# Patient Record
Sex: Female | Born: 1945 | Race: White | Hispanic: No | Marital: Married | Smoking: Never smoker
Health system: Southern US, Community
[De-identification: ages and names within clinical notes are randomized; demographics above are authoritative.]

## PROBLEM LIST (undated history)

## (undated) HISTORY — PX: BREAST EXCISIONAL BIOPSY: SUR124

---

## 1979-02-04 HISTORY — PX: BREAST BIOPSY: SHX20

## 2004-03-01 ENCOUNTER — Ambulatory Visit: Payer: Self-pay | Admitting: Obstetrics and Gynecology

## 2005-03-07 ENCOUNTER — Ambulatory Visit: Payer: Self-pay | Admitting: Obstetrics and Gynecology

## 2006-02-20 ENCOUNTER — Ambulatory Visit: Payer: Self-pay | Admitting: Gastroenterology

## 2006-04-02 ENCOUNTER — Ambulatory Visit: Payer: Self-pay | Admitting: Obstetrics and Gynecology

## 2006-10-29 ENCOUNTER — Ambulatory Visit: Payer: Self-pay | Admitting: Family Medicine

## 2007-01-07 ENCOUNTER — Ambulatory Visit: Payer: Self-pay | Admitting: Obstetrics and Gynecology

## 2007-04-08 ENCOUNTER — Ambulatory Visit: Payer: Self-pay | Admitting: Unknown Physician Specialty

## 2008-04-10 ENCOUNTER — Ambulatory Visit: Payer: Self-pay | Admitting: Unknown Physician Specialty

## 2008-10-31 ENCOUNTER — Ambulatory Visit: Payer: Self-pay | Admitting: Unknown Physician Specialty

## 2009-06-25 ENCOUNTER — Ambulatory Visit: Payer: Self-pay | Admitting: Unknown Physician Specialty

## 2009-10-23 ENCOUNTER — Ambulatory Visit: Payer: Self-pay | Admitting: Family Medicine

## 2009-10-26 ENCOUNTER — Ambulatory Visit: Payer: Self-pay | Admitting: Family Medicine

## 2009-11-05 ENCOUNTER — Ambulatory Visit: Payer: Self-pay | Admitting: Family Medicine

## 2009-12-14 ENCOUNTER — Ambulatory Visit: Payer: Self-pay | Admitting: Unknown Physician Specialty

## 2009-12-20 LAB — PATHOLOGY REPORT

## 2010-01-04 ENCOUNTER — Ambulatory Visit: Payer: Self-pay | Admitting: Family Medicine

## 2010-07-11 ENCOUNTER — Ambulatory Visit: Payer: Self-pay | Admitting: Family Medicine

## 2011-02-17 ENCOUNTER — Other Ambulatory Visit: Payer: Self-pay | Admitting: Unknown Physician Specialty

## 2011-04-07 ENCOUNTER — Ambulatory Visit: Payer: Self-pay | Admitting: Unknown Physician Specialty

## 2011-04-26 ENCOUNTER — Emergency Department: Payer: Self-pay | Admitting: Emergency Medicine

## 2011-04-26 LAB — CK TOTAL AND CKMB (NOT AT ARMC)
CK, Total: 67 U/L (ref 21–215)
CK, Total: 64 U/L (ref 21–215)
CK-MB: 0.7 ng/mL (ref 0.5–3.6)
CK-MB: 0.6 ng/mL (ref 0.5–3.6)

## 2011-04-26 LAB — COMPREHENSIVE METABOLIC PANEL
Anion Gap: 11 (ref 7–16)
BUN: 11 mg/dL (ref 7–18)
Bilirubin,Total: 0.7 mg/dL (ref 0.2–1.0)
EGFR (African American): >60
EGFR (Non-African Amer.): 55 — ABNORMAL LOW
Glucose: 94 mg/dL (ref 65–99)
Osmolality: 277 (ref 275–301)
SGOT(AST): 31 U/L (ref 15–37)
SGPT (ALT): 26 U/L
Sodium: 139 mmol/L (ref 136–145)
Total Protein: 7.2 g/dL (ref 6.4–8.2)

## 2011-04-26 LAB — TROPONIN I: Troponin-I: <0.02 ng/mL

## 2011-04-26 LAB — CBC
HGB: 14.4 g/dL (ref 12.0–16.0)
MCH: 29.2 pg (ref 26.0–34.0)
MCV: 85 fL (ref 80–100)
Platelet: 92 10*3/uL — ABNORMAL LOW (ref 150–440)
RBC: 4.95 10*6/uL (ref 3.80–5.20)
WBC: 3.9 10*3/uL (ref 3.6–11.0)

## 2011-04-26 LAB — PROTIME-INR: INR: 1.1

## 2011-06-26 IMAGING — CR DG LUMBAR SPINE COMPLETE 4+V
1 series · 5 of 5 positions shown · non-contrast
Comparison: none

REASON FOR EXAM: chronic pain  ? compression fx
COMMENTS:

[Series 1: view not recorded · 0.17mm/px · 5 of 5 slices shown]
[im 1/5]
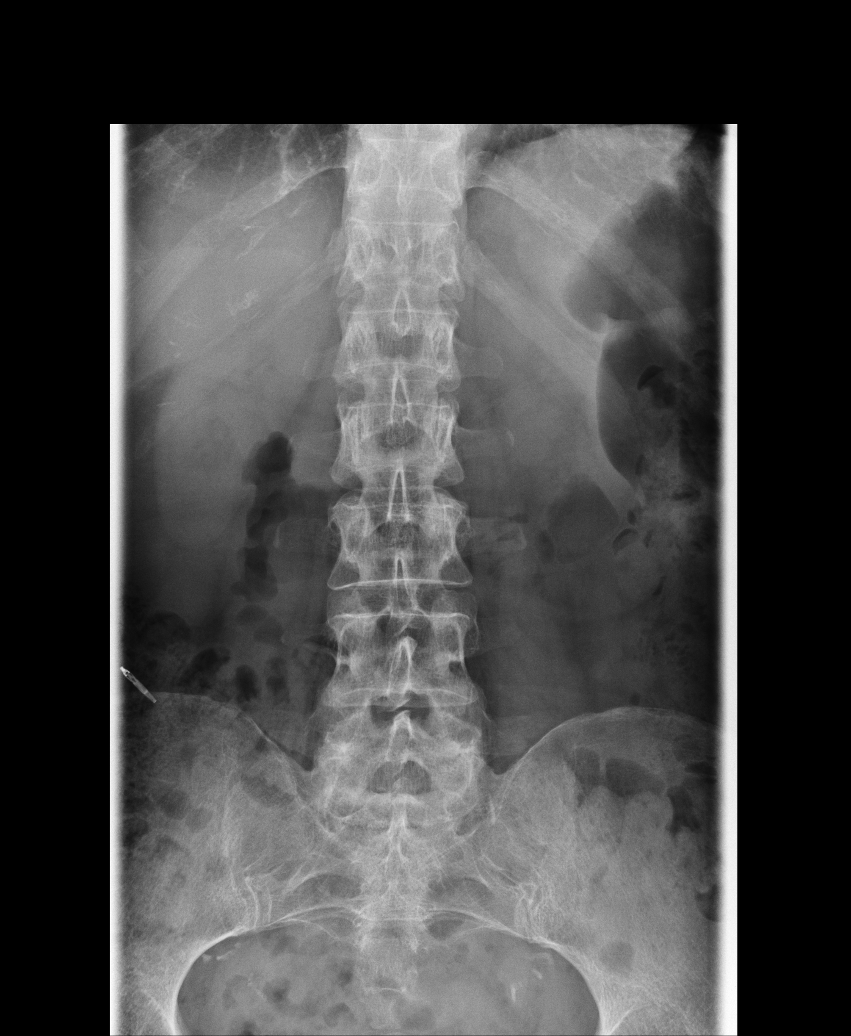
[im 2/5]
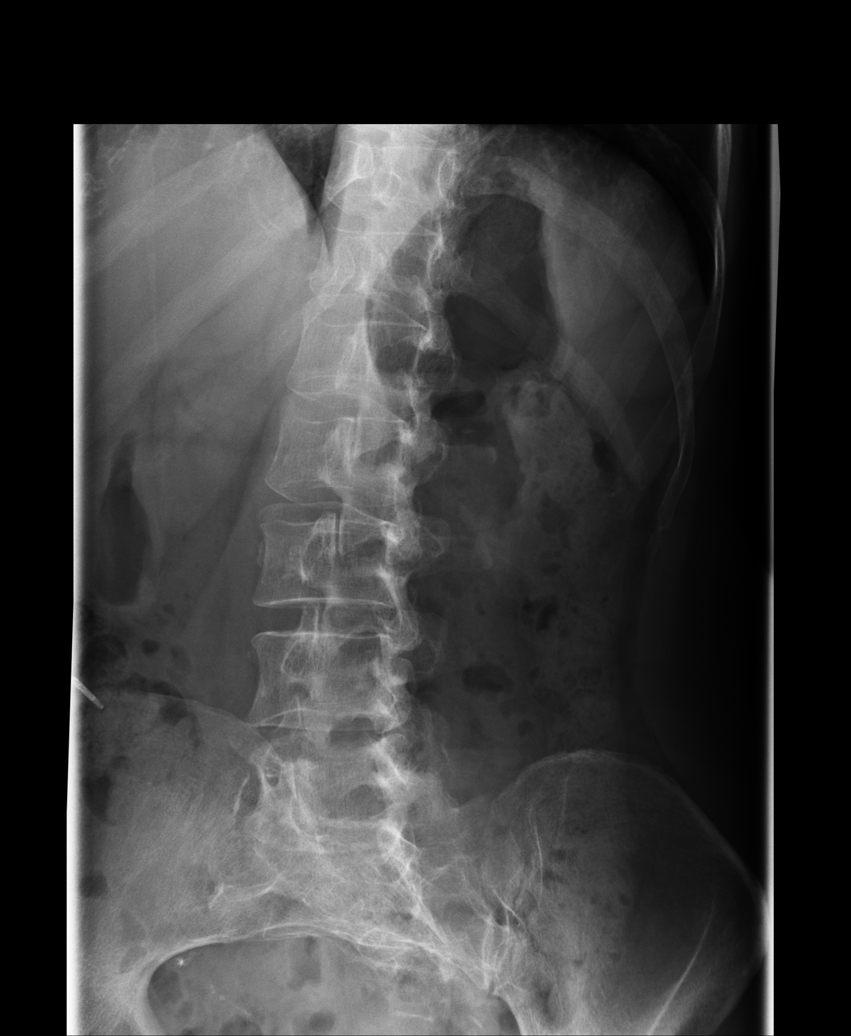
[im 3/5]
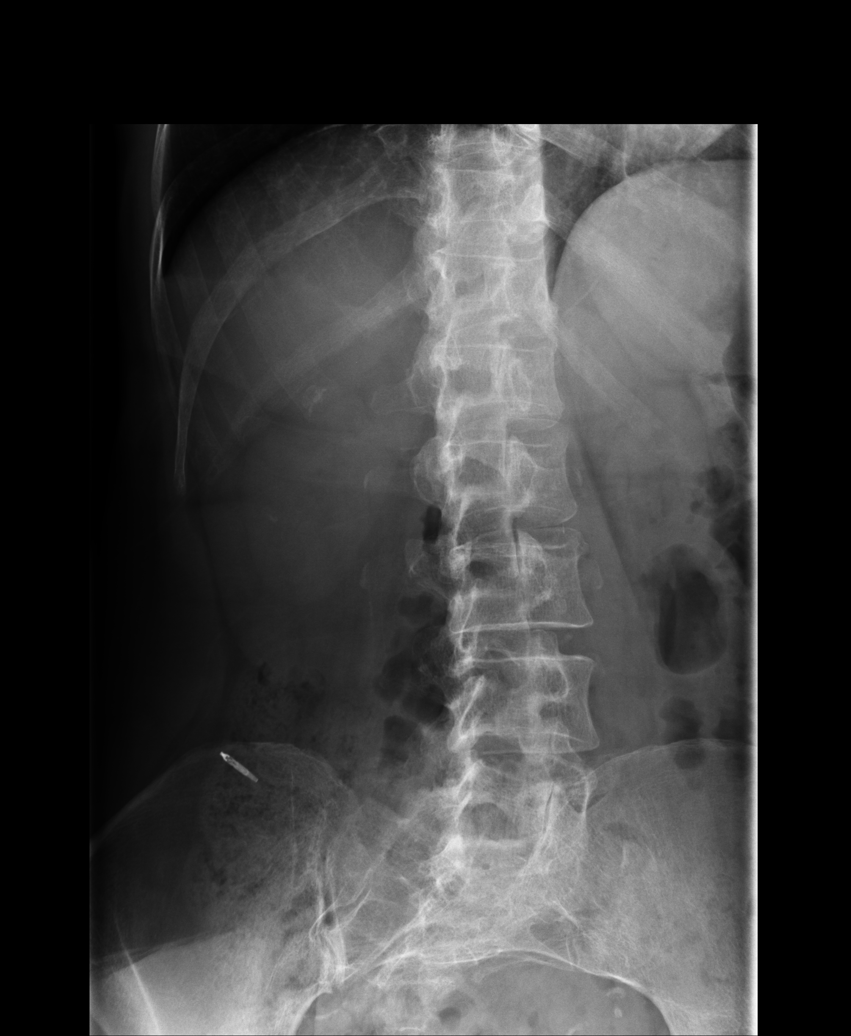
[im 4/5]
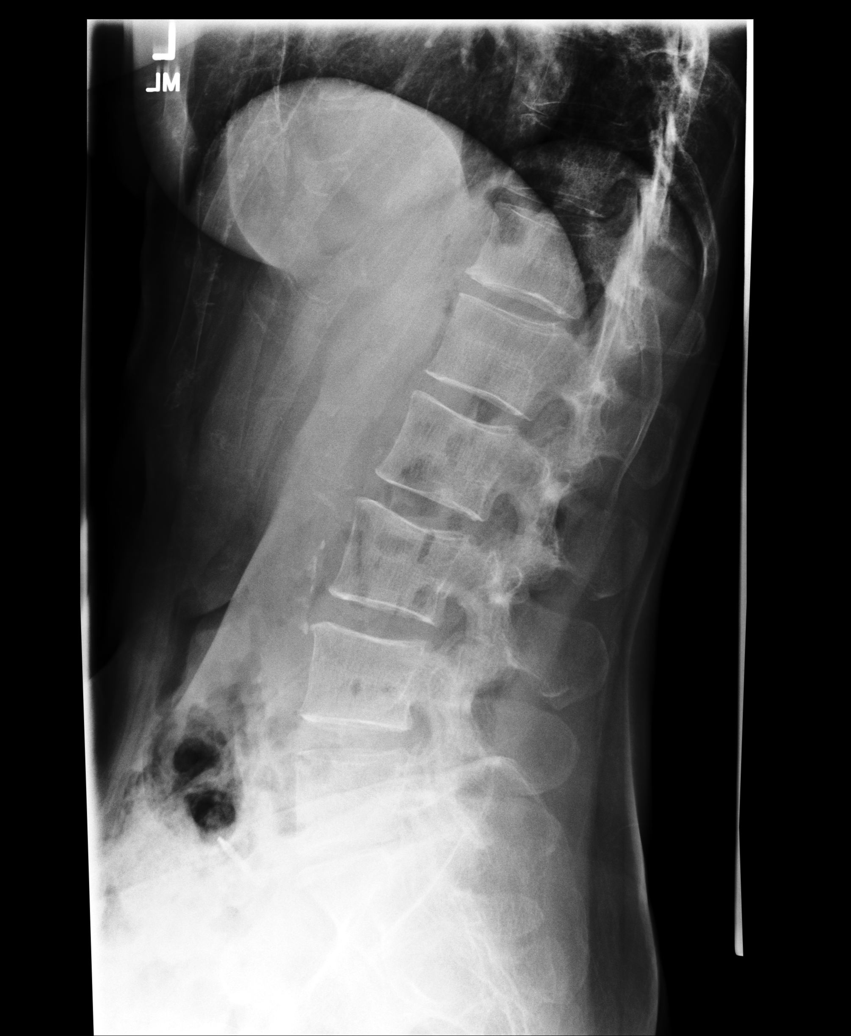
[im 5/5]
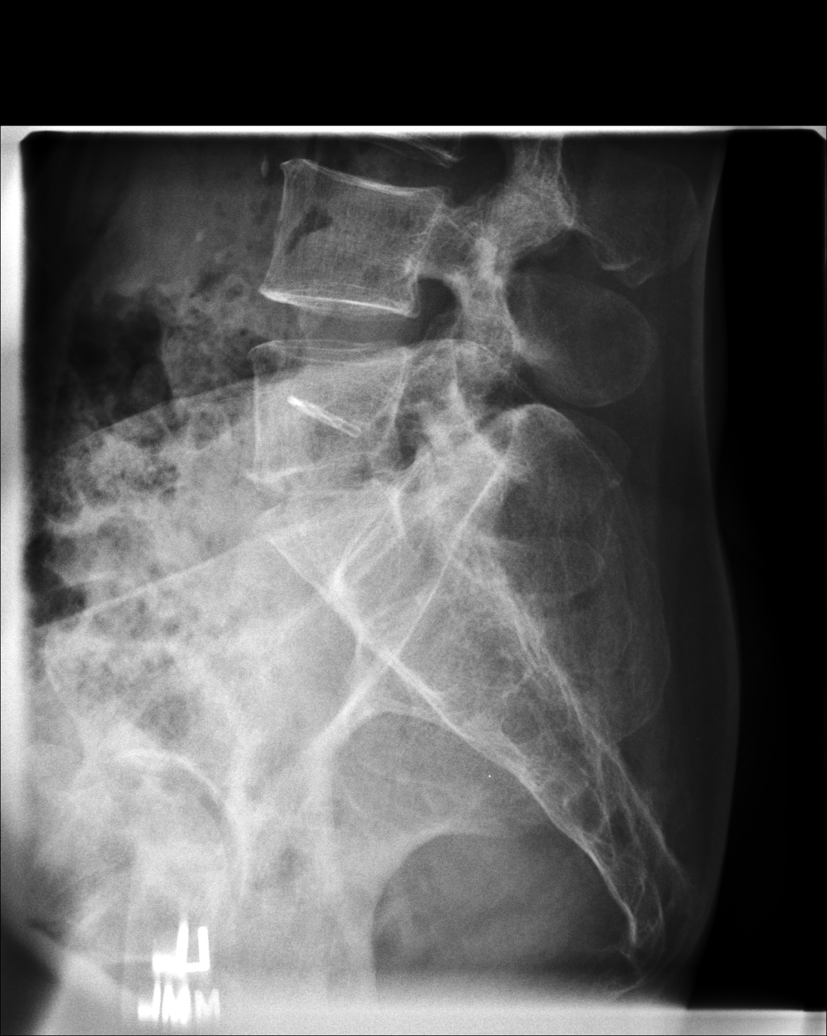

[5 of 5 positions shown; findings below may reference images not displayed]

PROCEDURE:     MDR - M[REDACTED] SPINE WITH OBLIQUES  - January 04, 2010  [DATE]

RESULT:     The lumbar vertebral bodies are preserved in height. The
intervertebral disc space heights are well-maintained. The spinous processes
are intact. I see no pars defects nor spondylolisthesis. The observed
portions of the sacrum appear normal.
IMPRESSION: I see no acute abnormality of the lumbar spine.

## 2011-07-29 ENCOUNTER — Ambulatory Visit: Payer: Self-pay

## 2012-06-03 ENCOUNTER — Ambulatory Visit: Payer: Self-pay | Admitting: Unknown Physician Specialty

## 2012-06-24 ENCOUNTER — Ambulatory Visit: Payer: Self-pay | Admitting: Surgery

## 2012-06-24 LAB — HEPATIC FUNCTION PANEL A (ARMC)
Albumin: 4.2 g/dL (ref 3.4–5.0)
Alkaline Phosphatase: 71 U/L (ref 50–136)
Bilirubin,Total: 0.6 mg/dL (ref 0.2–1.0)
SGPT (ALT): 24 U/L (ref 12–78)
Total Protein: 7.4 g/dL (ref 6.4–8.2)

## 2012-06-24 LAB — CBC WITH DIFFERENTIAL/PLATELET
Basophil %: 0.4 %
Eosinophil #: 0.1 10*3/uL (ref 0.0–0.7)
Lymphocyte %: 25.9 %
MCV: 84 fL (ref 80–100)
Monocyte #: 0.3 x10 3/mm (ref 0.2–0.9)
Monocyte %: 6.8 %
Neutrophil #: 3 10*3/uL (ref 1.4–6.5)
Platelet: 111 10*3/uL — ABNORMAL LOW (ref 150–440)
RBC: 5.1 10*6/uL (ref 3.80–5.20)
RDW: 13.2 % (ref 11.5–14.5)

## 2012-06-24 LAB — BASIC METABOLIC PANEL
Calcium, Total: 9.5 mg/dL (ref 8.5–10.1)
Chloride: 106 mmol/L (ref 98–107)
Co2: 31 mmol/L (ref 21–32)
Creatinine: 1.1 mg/dL (ref 0.60–1.30)
Glucose: 67 mg/dL (ref 65–99)
Osmolality: 278 (ref 275–301)
Sodium: 140 mmol/L (ref 136–145)

## 2012-07-15 ENCOUNTER — Ambulatory Visit: Payer: Self-pay | Admitting: Surgery

## 2012-08-23 ENCOUNTER — Ambulatory Visit: Payer: Self-pay | Admitting: Gastroenterology

## 2012-09-16 ENCOUNTER — Ambulatory Visit: Payer: Self-pay | Admitting: Gastroenterology

## 2012-09-20 LAB — PATHOLOGY REPORT

## 2012-11-23 ENCOUNTER — Ambulatory Visit: Payer: Self-pay

## 2013-08-19 ENCOUNTER — Ambulatory Visit: Payer: Self-pay | Admitting: Physician Assistant

## 2013-11-25 ENCOUNTER — Ambulatory Visit: Payer: Self-pay | Admitting: Internal Medicine

## 2014-05-26 NOTE — H&P (Signed)
PATIENT NAME:  Jillian [name], Jillian [name] MR#:  [id number] DATE OF BIRTH:  05-05-1945  DATE OF OPERATION:  07/15/2012  PREOPERATIVE HISTORY AND PHYSICAL  CHIEF COMPLAINT:  Gallbladder polyps.   HISTORY OF PRESENT ILLNESS:  This is a patient with barely atypical gallbladder symptoms. In fact, she only has left upper quadrant pain on occasion. She has been on PPI and H2-blockers with no relief and no classic symptoms of right upper quadrant pain. She has possibly had some acholic stools in the past. No melena or hematochezia. However, on ultrasound repeated multiple times she has had gallbladder polyps and has failed to show clearcut gallbladder gallstones but polyps have persisted. She is here for elective laparoscopic cholecystectomy for these polyps with potential for malignancy.   PAST MEDICAL HISTORY:  History of West Nile virus, thrombocytopenia associated with a viremia, history of colon polyps gastritis and hypothyroidism.   PAST SURGICAL HISTORY: Appendectomy, Meckel's diverticulectomy, lumpectomy of the breast and thyroidectomy total.   MEDICATIONS:  Multiple, see chart.   ALLERGIES:  IODINATED CONTRAST MEDIA.   SOCIAL HISTORY:  The patient does not smoke or drink. She is an Charity fundraiserN.   FAMILY HISTORY: Noncontributory.  REVIEW OF SYSTEMS: 10-system review has been performed and is documented in the office chart, negative with the exception of that mentioned in the HPI.  PHYSICAL EXAMINATION: GENERAL: Healthy-appearing female patient.  HEENT:  Shows no scleral icterus. No palpable neck nodes.  CHEST:  Clear to auscultation.  CARDIAC:  Regular rate and rhythm.  ABDOMEN: Soft, nontender. No hernias are noted. There is a right lower quadrant appendectomy-type incision and a right paramedian incision.  EXTREMITIES:  Without edema.  NEUROLOGIC:  Grossly intact.  INTEGUMENT:  Shows no jaundice.   A hepatobiliary scan is normal with a 46% ejection fraction. Abdominal ultrasound shows multiple  gallbladder polyps, largest being 6 mm in size.   LABORATORY VALUES: Demonstrate white blood cell count of 4.6, H and H of 14.7 and 42.6 and a platelet count of 111. AST, ALT and bilirubin are within normal limits.   ASSESSMENT AND PLAN:  This is a patient with gallbladder polyps. No classic symptoms of gallbladder disease but persistent polyps on repeated ultrasounds. I have recommended laparoscopic cholecystectomy because of the malignant potential. The rationale for this has been discussed. The options of observation have been reviewed. The risks of bleeding, infection, recurrence of symptoms and failure to resolve any symptoms that she may have, bile duct damage, bile duct leak, retained common bile duct stone, any of which could require further surgery and the need for further surgery if malignancy were identified was all discussed. She understood and agreed to proceed.   ____________________________ Adah Salvageichard E. Excell Seltzerooper, MD rec:ce D: 07/14/2012 17:51:14 ET [name]: 07/14/2012 18:25:43 ET JOB#: [id number]  cc: Adah Salvageichard E. Excell Seltzerooper, MD, <Dictator> Lattie HawICHARD E Irys Nigh MD ELECTRONICALLY SIGNED 07/17/2012 11:22

## 2014-05-26 NOTE — Op Note (Signed)
PATIENT NAME:  Jillian [name], BOZARTH MR#:  [id number] DATE OF BIRTH:  [birth year]  DATE OF PROCEDURE:  07/15/2012  PREOPERATIVE DIAGNOSIS: Gallbladder polyps.   POSTOPERATIVE DIAGNOSIS: Gallbladder polyps.   PROCEDURE: Laparoscopic cholecystectomy with C-arm fluoroscopic cholangiography.   SURGEON: Makaia Rappa E. Excell Seltzer, MD  ANESTHESIA: General with endotracheal tube.    ASSISTANT: Trellis Moment, PA-S  INDICATIONS: This is a patient with gallbladder polyps with symptoms that are not referable to typical gallbladder symptoms; however, because of the routine monitoring of gallbladder polyps that are increasing in size and number, she is requesting laparoscopic cholecystectomy.   Preoperatively, we discussed the rationale for surgery, the options of observation, risk of bleeding, infection, recurrence of symptoms, failure to resolve any of her symptoms. She understood and agreed to proceed. We also discussed the risk of bile duct injury, bile duct leak, the need for ERCP or further surgery. This was reviewed as well.   FINDINGS: Normal-appearing gallbladder. No palpable stones or polyps. C-arm fluoroscopic cholangiography demonstrated good flow in the duodenum without intraluminal filling defects. Proximal ducts were well identified, and the cystic duct had been cannulated.  DESCRIPTION OF PROCEDURE: The patient was induced to general anesthesia. She was given IV antibiotics. VTE prophylaxis was in place. SHE WAS ALSO GIVEN BENADRYL AND SOLU-CORTEF FOR HER DYE ALLERGY.   She was prepped and draped in a sterile fashion. Marcaine was infiltrated in skin and subcutaneous tissues around the periumbilical area. Incision was made. Veress needle was placed. Pneumoperitoneum was obtained, and a 5 mm trocar port was placed. The abdominal cavity was explored. Adhesions were noted in the right lower and right upper quadrant to colon. Manipulation around these adhesions demonstrated an open space around the  gallbladder and liver, and under direct vision, a 10 mm epigastric port and 2 lateral 5 mm ports were placed.   The camera was placed in the epigastric site to view back at the adhesions, and no injury of the bowel or adhesions had been noted, and the entry point of the periumbilical port was well away from the attachment of any of these adhesions. Again, no bowel injury was noted.   With the camera in the periumbilical site, the gallbladder was placed on tension. The peritoneum over the infundibulum was incised bluntly. The cystic duct-gallbladder junction was well identified, clipped and incised, and through a separate incision, a cholangiogram catheter was placed. C-arm fluoroscopic cholangiography demonstrated the above. The cholangiogram catheter was removed. The cystic duct was doubly clipped and divided. The cystic artery was doubly clipped and divided, and then the gallbladder was taken from the gallbladder fossa with electrocautery and passed out through the epigastric port site. The port site was replaced. The area was irrigated with copious amounts of normal saline. There was no sign of bleeding, bile leak or bowel injury   Viewing the left upper quadrant, there was no ability to see the spleen as it was buried beneath omentum and greater curve of the stomach, and without manipulation of this area, the spleen could not be identified.   Camera was placed again in the epigastric site to view back to the periumbilical site. Again, no sign of bowel injury. Therefore, pneumoperitoneum was released. All ports were removed. Fascial edges at the epigastric site were approximated with figure-of-eight 0 Vicryls, 4-0 subcuticular Monocryl was used on all skin edges. Steri-Strips, Mastisol and sterile dressings were placed.   The patient tolerated the procedure well. There were no complications. She was taken to the recovery room in  stable condition to be discharged in the care of her family. Follow up in  10 days.   ____________________________ Adah Salvageichard E. Excell Seltzerooper, MD rec:OSi D: 07/15/2012 10:27:40 ET T: 07/15/2012 11:58:08 ET JOB#: [id number]  cc: Adah Salvageichard E. Excell Seltzerooper, MD, <Dictator> Lattie HawICHARD E Chip Canepa MD ELECTRONICALLY SIGNED 07/17/2012 11:22

## 2014-11-06 ENCOUNTER — Other Ambulatory Visit: Payer: Self-pay | Admitting: Internal Medicine

## 2014-11-06 DIAGNOSIS — Z1231 Encounter for screening mammogram for malignant neoplasm of breast: Secondary | ICD-10-CM

## 2014-11-15 ENCOUNTER — Ambulatory Visit: Payer: Self-pay

## 2014-12-05 ENCOUNTER — Ambulatory Visit
Admission: RE | Admit: 2014-12-05 | Discharge: 2014-12-05 | Disposition: A | Discharge status: Home / Self Care | Payer: Medicare Other | Source: Ambulatory Visit | Attending: Internal Medicine | Admitting: Internal Medicine

## 2014-12-05 DIAGNOSIS — Z1231 Encounter for screening mammogram for malignant neoplasm of breast: Secondary | ICD-10-CM | POA: Diagnosis not present

## 2014-12-08 ENCOUNTER — Other Ambulatory Visit: Payer: Self-pay | Admitting: Internal Medicine

## 2014-12-08 DIAGNOSIS — R928 Other abnormal and inconclusive findings on diagnostic imaging of breast: Secondary | ICD-10-CM

## 2014-12-11 ENCOUNTER — Ambulatory Visit
Admission: RE | Admit: 2014-12-11 | Discharge: 2014-12-11 | Disposition: A | Discharge status: Home / Self Care | Payer: Medicare Other | Source: Ambulatory Visit | Attending: Internal Medicine | Admitting: Internal Medicine

## 2014-12-11 DIAGNOSIS — R928 Other abnormal and inconclusive findings on diagnostic imaging of breast: Secondary | ICD-10-CM

## 2015-10-11 ENCOUNTER — Other Ambulatory Visit: Payer: Self-pay | Admitting: Obstetrics and Gynecology

## 2015-10-11 DIAGNOSIS — Z1231 Encounter for screening mammogram for malignant neoplasm of breast: Secondary | ICD-10-CM

## 2015-12-10 ENCOUNTER — Ambulatory Visit
Admission: RE | Admit: 2015-12-10 | Discharge: 2015-12-10 | Disposition: A | Discharge status: Home / Self Care | Payer: Medicare Other | Source: Ambulatory Visit | Attending: Obstetrics and Gynecology | Admitting: Obstetrics and Gynecology

## 2015-12-10 DIAGNOSIS — Z1231 Encounter for screening mammogram for malignant neoplasm of breast: Secondary | ICD-10-CM | POA: Diagnosis not present

## 2016-10-30 ENCOUNTER — Other Ambulatory Visit: Payer: Self-pay | Admitting: Obstetrics and Gynecology

## 2016-10-30 ENCOUNTER — Other Ambulatory Visit: Payer: Self-pay | Admitting: Internal Medicine

## 2016-10-30 DIAGNOSIS — Z1231 Encounter for screening mammogram for malignant neoplasm of breast: Secondary | ICD-10-CM

## 2016-12-12 ENCOUNTER — Ambulatory Visit
Admission: RE | Admit: 2016-12-12 | Discharge: 2016-12-12 | Disposition: A | Discharge status: Home / Self Care | Payer: Medicare Other | Source: Ambulatory Visit | Attending: Internal Medicine | Admitting: Internal Medicine

## 2016-12-12 DIAGNOSIS — Z1231 Encounter for screening mammogram for malignant neoplasm of breast: Secondary | ICD-10-CM | POA: Diagnosis present

## 2017-04-30 ENCOUNTER — Emergency Department: Payer: Medicare Other

## 2017-04-30 ENCOUNTER — Other Ambulatory Visit: Payer: Self-pay

## 2017-04-30 ENCOUNTER — Emergency Department
Admission: EM | Admit: 2017-04-30 | Discharge: 2017-04-30 | Disposition: A | Discharge status: Home / Self Care | Payer: Medicare Other | Attending: Emergency Medicine | Admitting: Emergency Medicine

## 2017-04-30 DIAGNOSIS — R42 Dizziness and giddiness: Secondary | ICD-10-CM | POA: Diagnosis not present

## 2017-04-30 DIAGNOSIS — R110 Nausea: Secondary | ICD-10-CM | POA: Diagnosis not present

## 2017-04-30 DIAGNOSIS — R51 Headache: Secondary | ICD-10-CM | POA: Diagnosis not present

## 2017-04-30 DIAGNOSIS — R519 Headache, unspecified: Secondary | ICD-10-CM

## 2017-04-30 DIAGNOSIS — I159 Secondary hypertension, unspecified: Secondary | ICD-10-CM | POA: Diagnosis present

## 2017-04-30 LAB — CBC WITH DIFFERENTIAL/PLATELET
Basophils Absolute: 0 10*3/uL (ref 0–0.1)
Basophils Relative: 1 %
EOS ABS: 0 10*3/uL (ref 0–0.7)
EOS PCT: 1 %
HCT: 40.3 % (ref 35.0–47.0)
HEMOGLOBIN: 13.9 g/dL (ref 12.0–16.0)
LYMPHS ABS: 0.9 10*3/uL — AB (ref 1.0–3.6)
Lymphocytes Relative: 17 %
MCH: 29.1 pg (ref 26.0–34.0)
MCHC: 34.6 g/dL (ref 32.0–36.0)
MCV: 84.4 fL (ref 80.0–100.0)
MONOS PCT: 6 %
Monocytes Absolute: 0.3 10*3/uL (ref 0.2–0.9)
Neutro Abs: 4.4 10*3/uL (ref 1.4–6.5)
Neutrophils Relative %: 77 %
PLATELETS: 105 10*3/uL — AB (ref 150–440)
RBC: 4.77 MIL/uL (ref 3.80–5.20)
RDW: 14.1 % (ref 11.5–14.5)
WBC: 5.7 10*3/uL (ref 3.6–11.0)

## 2017-04-30 LAB — BASIC METABOLIC PANEL
Anion gap: 10 (ref 5–15)
BUN: 13 mg/dL (ref 6–20)
CHLORIDE: 104 mmol/L (ref 101–111)
CO2: 25 mmol/L (ref 22–32)
CREATININE: 1 mg/dL (ref 0.44–1.00)
Calcium: 9.5 mg/dL (ref 8.9–10.3)
GFR calc Af Amer: >60 mL/min (ref >60)
GFR calc non Af Amer: 55 mL/min — ABNORMAL LOW (ref >60)
GLUCOSE: 95 mg/dL (ref 65–99)
Potassium: 3.5 mmol/L (ref 3.5–5.1)
Sodium: 139 mmol/L (ref 135–145)

## 2017-04-30 MED ORDER — ACETAMINOPHEN 500 MG PO TABS
1000.0000 mg | ORAL_TABLET | Freq: Once | ORAL | Status: AC
  Administered 2017-04-30: 1000 mg via ORAL
  Filled 2017-04-30: qty 2

## 2017-04-30 MED ORDER — PROMETHAZINE HCL 25 MG PO TABS
25.0000 mg | ORAL_TABLET | Freq: Once | ORAL | Status: AC
  Administered 2017-04-30: 25 mg via ORAL
  Filled 2017-04-30: qty 1

## 2017-04-30 MED ORDER — PROMETHAZINE HCL 12.5 MG PO TABS: ORAL_TABLET | ORAL | 0 refills | Status: AC

## 2017-04-30 MED ORDER — CYCLOBENZAPRINE HCL 10 MG PO TABS
5.0000 mg | ORAL_TABLET | Freq: Once | ORAL | Status: AC
  Administered 2017-04-30: 5 mg via ORAL
  Filled 2017-04-30: qty 1

## 2017-04-30 MED ORDER — DIPHENHYDRAMINE HCL 25 MG PO CAPS
25.0000 mg | ORAL_CAPSULE | Freq: Once | ORAL | Status: AC
  Administered 2017-04-30: 25 mg via ORAL
  Filled 2017-04-30: qty 1

## 2017-04-30 MED ORDER — MECLIZINE HCL 12.5 MG PO TABS: ORAL_TABLET | ORAL | 0 refills | Status: AC

## 2017-04-30 NOTE — ED Triage Notes (Signed)
Pt states her BP was elevated at home and headache (started on the 5th when she had kenalog injection)

## 2017-04-30 NOTE — Discharge Instructions (Addendum)
Your exam, labs, and CT scan are all essentially normal today. Continue to monitor your blood pressure (2-3 times a day). Take the home meds as discussed. Take the prescription meds as directed. Follow-up with your provider or Dr. Sherryll BurgerShah for ongoing headache management. Return to the ED for worsening or intractable pain.

## 2017-04-30 NOTE — ED Provider Notes (Signed)
[facility] Emergency Department Provider Note ____________________________________________  Time seen: 1350  I have reviewed the triage vital signs and the nursing notes.  HISTORY  Chief Complaint  Hypertension  HPI Jillian [name] is a 72 y.o. female to the ED accompanied by her husband, for concern of elevated blood pressures.  Patient has been monitoring her blood pressure since yesterday.  She apparently was seen by her primary care provider yesterday, and started on a low-dose amlodipine.  Patient describes onset of headaches 3 weeks earlier, following in an dermal injection of Kenalog.  She was treated by a local dermatologist for a resume posterior scalp sebaceous cyst.  The dermal cyst was injected with Kenalog at the time.  Patient's been managing her headaches at home with Tylenol but denies any significant benefit.  She apparently checked her blood pressure a few days ago and noted it to be elevated.  She scheduled appointment with her PCPs PA provider, and was started on a low-dose of amlodipine at 2.5 mg daily.  She took her first dose yesterday and has been monitoring her blood pressures about every 2 hours since.  She noted blood pressures ranging from 140-180 systolic and 80-100 diastolic.  Today she dosed a second amlodipine about about 10 AM.  At that point she began to experience some dizziness and nausea.  Also is noted her headache persist primarily on the left hemisphere of the head from the forehead to the occiput.  Patient is about 1 month out from a cataract surgery on her eyes.  She continues to note light sensitivity with the headache pain.  Her headache is not significantly management Tylenol or naproxen she is been dosing.  She reports headache pain at a 7 out of 10 currently.  She denies any visual disturbance of above baseline.  She also denies any weakness, chest pain, shortness of breath, or recent illness.  She is without any distal  paresthesias, footdrop, or falls.  Patient is a retired Programmer, applications, and is not sure what the underlying cause for her headache, hypertension, and new onset dizziness is.  History reviewed. No pertinent past medical history.  There are no active problems to display for this patient.   Past Surgical History:  Procedure Laterality Date  . BREAST BIOPSY Right 1981   neg    Prior to Admission medications   Medication Sig Start Date End Date Taking? Authorizing Provider  meclizine (ANTIVERT) 12.5 MG tablet Take 1-2 tabs every 8 hour PRN for N&V 04/30/17   Meah Jiron, Charlesetta Ivory, PA-C  promethazine (PHENERGAN) 12.5 MG tablet Take 1-2 tabs every 8 hours as needed for nausea & vomiting 04/30/17   Neftali Abair, Charlesetta Ivory, PA-C    Allergies Atorvastatin and Ivp dye [iodinated diagnostic agents]  No family history on file.  Social History Social History   Tobacco Use  . Smoking status: Never Smoker  . Smokeless tobacco: Never Used  Substance Use Topics  . Alcohol use: Never    Frequency: Never  . Drug use: Never    Review of Systems  Constitutional: Negative for fever. Eyes: Negative for visual changes. ENT: Negative for sore throat. Cardiovascular: Negative for chest pain. Notes elevated BPs. Respiratory: Negative for shortness of breath. Gastrointestinal: Negative for abdominal pain, vomiting and diarrhea. Genitourinary: Negative for dysuria. Musculoskeletal: Negative for back pain. Skin: Negative for rash. Neurological: Negative for focal weakness or numbness. Reports headache, vertigo and dizziness.  ____________________________________________  PHYSICAL EXAM:  VITAL SIGNS: ED Triage  Vitals [04/30/17 1328]  Enc Vitals Group     BP (!) 173/74     Pulse Rate 71     Resp 15     Temp (!) 97.4 F (36.3 C)     Temp Source Oral     SpO2 100 %     Weight 128 lb (58.1 kg)     Height 5\' 9"  (1.753 m)     Head Circumference      Peak Flow      Pain Score 5     Pain  Loc      Pain Edu?      Excl. in GC?     Constitutional: Alert and oriented. Well appearing and in no distress. Head: Normocephalic and atraumatic. Eyes: Conjunctivae are normal. PERRL. Normal extraocular movements. Light sensitive.  Ears: Canals clear. TMs intact bilaterally. Nose: No congestion/rhinorrhea/epistaxis. Mouth/Throat: Mucous membranes are moist. Neck: Supple. No thyromegaly. Normal ROM. No palpable cysts, masses, or abscesses.  Cardiovascular: Normal rate, regular rhythm. Normal distal pulses. Respiratory: Normal respiratory effort. No wheezes/rales/rhonchi. Gastrointestinal: Soft and nontender. No distention. Musculoskeletal: Nontender with normal range of motion in all extremities.  Neurologic: Cranial nerves II through XII grossly intact.  Patient with slow transition from sit to stand.  She is unable to walk unassisted or without standby assistance due to dizziness.  Normal speech and language. No gross focal neurologic deficits are appreciated. Skin:  Skin is warm, dry and intact. No rash noted. Psychiatric: Mood and affect are normal. Patient exhibits appropriate insight and judgment. ____________________________________________   LABS (pertinent positives/negatives)  Labs Reviewed  BASIC METABOLIC PANEL - Abnormal; Notable for the following components:      Result Value   GFR calc non Af Amer 55 (*)    All other components within normal limits  CBC WITH DIFFERENTIAL/PLATELET - Abnormal; Notable for the following components:   Platelets 105 (*)    Lymphs Abs 0.9 (*)    All other components within normal limits  ____________________________________________   RADIOLOGY  Head Ct w/o CM  IMPRESSION: Age related volume loss with slight periventricular small vessel disease. No mass or hemorrhage. No evident acute infarct.  There are foci of arterial vascular calcification. There is mucosal thickening in several ethmoid air  cells. ____________________________________________  PROCEDURES  Procedures Promethazine 25 mg PO Diphenhydramine 25 mg PO Cyclobenzaprine 5 mg PO Acetaminophen 1000 mg PO ____________________________________________  INITIAL IMPRESSION / ASSESSMENT AND PLAN / ED COURSE  Patient with ED evaluation of 3-week complaint of intermittent left-sided headaches as well as recent elevation of her blood pressures above baseline.  Patient with an overall benign exam is reassured by her normal labs and negative head CT.  She and her husband have been very appreciative of the care provided.  And have been fully involved in the patient's treatment.  She has some previous medication sensitivities and is inclined to be discharged with a prescription for Phenergan and meclizine which she is tolerated in the past.  She will continue to dose Tylenol for headache pain relief and avoid any anti-inflammatories until her blood pressures normalized.  She will continue with the amlodipine at 5 mg daily as prescribed.  She is encouraged to monitor blood pressures but to limit herself to 2-3 blood pressure readings per day.  She will follow-up with Dr. Sherryll Burger neurology for ongoing persistent headache pain.  Return precautions have been reviewed with the patient and her husband.  They verbalized understanding of all instructions and are happy to  discharge to follow-up with PCP. ____________________________________________  FINAL CLINICAL IMPRESSION(S) / ED DIAGNOSES  Final diagnoses:  Secondary hypertension  Dizziness  Acute intractable headache, unspecified headache type      Lissa HoardMenshew, Veneta Sliter V Bacon, PA-C 04/30/17 1754    Phineas SemenGoodman, Graydon, MD 04/30/17 [phone]

## 2017-04-30 NOTE — ED Notes (Signed)
Patient transported to CT 

## 2017-10-16 ENCOUNTER — Other Ambulatory Visit (HOSPITAL_COMMUNITY): Payer: Self-pay | Admitting: Neurology

## 2017-10-16 DIAGNOSIS — R413 Other amnesia: Secondary | ICD-10-CM

## 2017-10-21 ENCOUNTER — Ambulatory Visit (HOSPITAL_COMMUNITY): Payer: Medicare Other

## 2017-10-27 ENCOUNTER — Ambulatory Visit (HOSPITAL_COMMUNITY): Payer: Medicare Other

## 2017-11-03 ENCOUNTER — Ambulatory Visit (HOSPITAL_COMMUNITY)
Admission: RE | Admit: 2017-11-03 | Discharge: 2017-11-03 | Disposition: A | Discharge status: Home / Self Care | Payer: Medicare Other | Source: Ambulatory Visit | Attending: Neurology | Admitting: Neurology

## 2017-11-03 DIAGNOSIS — R413 Other amnesia: Secondary | ICD-10-CM | POA: Insufficient documentation

## 2017-11-03 DIAGNOSIS — R9082 White matter disease, unspecified: Secondary | ICD-10-CM | POA: Insufficient documentation

## 2017-11-11 ENCOUNTER — Other Ambulatory Visit: Payer: Self-pay | Admitting: Internal Medicine

## 2017-11-11 DIAGNOSIS — Z1231 Encounter for screening mammogram for malignant neoplasm of breast: Secondary | ICD-10-CM

## 2017-12-18 ENCOUNTER — Ambulatory Visit
Admission: RE | Admit: 2017-12-18 | Discharge: 2017-12-18 | Disposition: A | Discharge status: Home / Self Care | Payer: Medicare Other | Source: Ambulatory Visit | Attending: Internal Medicine | Admitting: Internal Medicine

## 2017-12-18 DIAGNOSIS — Z1231 Encounter for screening mammogram for malignant neoplasm of breast: Secondary | ICD-10-CM | POA: Insufficient documentation

## 2018-01-13 ENCOUNTER — Ambulatory Visit: Payer: Self-pay | Admitting: Physical Therapy

## 2018-01-15 ENCOUNTER — Ambulatory Visit: Payer: Self-pay | Admitting: Physical Therapy

## 2018-11-04 ENCOUNTER — Other Ambulatory Visit: Payer: Self-pay | Admitting: Internal Medicine

## 2018-11-04 DIAGNOSIS — Z1231 Encounter for screening mammogram for malignant neoplasm of breast: Secondary | ICD-10-CM

## 2018-11-16 ENCOUNTER — Other Ambulatory Visit: Payer: Self-pay | Admitting: Internal Medicine

## 2018-11-16 DIAGNOSIS — R413 Other amnesia: Secondary | ICD-10-CM

## 2018-11-28 ENCOUNTER — Other Ambulatory Visit: Payer: Self-pay

## 2018-11-28 ENCOUNTER — Ambulatory Visit
Admission: RE | Admit: 2018-11-28 | Discharge: 2018-11-28 | Disposition: A | Discharge status: Home / Self Care | Payer: Medicare Other | Source: Ambulatory Visit | Attending: Internal Medicine | Admitting: Internal Medicine

## 2018-11-28 DIAGNOSIS — R413 Other amnesia: Secondary | ICD-10-CM | POA: Insufficient documentation

## 2018-12-21 ENCOUNTER — Ambulatory Visit
Admission: RE | Admit: 2018-12-21 | Discharge: 2018-12-21 | Disposition: A | Discharge status: Home / Self Care | Payer: Medicare Other | Source: Ambulatory Visit | Attending: Internal Medicine | Admitting: Internal Medicine

## 2018-12-21 DIAGNOSIS — Z1231 Encounter for screening mammogram for malignant neoplasm of breast: Secondary | ICD-10-CM | POA: Diagnosis present

## 2019-03-26 ENCOUNTER — Ambulatory Visit: Payer: Medicare Other | Attending: Internal Medicine

## 2019-03-26 DIAGNOSIS — Z23 Encounter for immunization: Secondary | ICD-10-CM | POA: Insufficient documentation

## 2019-03-26 NOTE — Progress Notes (Signed)
   Covid-19 Vaccination Clinic  Name:  Tanya Crothers    MRN: [id number] DOB: 1945/06/09  03/26/2019  Ms. Lasseigne was observed post Covid-19 immunization for 15 minutes without incidence. She was provided with Vaccine Information Sheet and instruction to access the V-Safe system.   Ms. Kisamore was instructed to call 911 with any severe reactions post vaccine: Marland Kitchen Difficulty breathing  . Swelling of your face and throat  . A fast heartbeat  . A bad rash all over your body  . Dizziness and weakness    Immunizations Administered    Name Date Dose VIS Date Route   Pfizer COVID-19 Vaccine 03/26/2019 11:03 AM 0.3 mL 01/14/2019 Intramuscular   Manufacturer: [employer], [employer]   Lot: [id number]   NDC: [address]-3

## 2019-04-19 ENCOUNTER — Ambulatory Visit: Payer: Medicare Other | Attending: Internal Medicine

## 2019-04-19 DIAGNOSIS — Z23 Encounter for immunization: Secondary | ICD-10-CM

## 2019-04-19 NOTE — Progress Notes (Signed)
   Covid-19 Vaccination Clinic  Name:  Jillian [name]    MRN: [id number] DOB: 1945-02-17  04/19/2019  [name] was observed post Covid-19 immunization for 15 minutes without incident. She was provided with Vaccine Information Sheet and instruction to access the V-Safe system.   [name] was instructed to call 911 with any severe reactions post vaccine: Marland Kitchen Difficulty breathing  . Swelling of face and throat  . A fast heartbeat  . A bad rash all over body  . Dizziness and weakness   Immunizations Administered    Name Date Dose VIS Date Route   Pfizer COVID-19 Vaccine 04/19/2019 11:10 AM 0.3 mL 01/14/2019 Intramuscular   Manufacturer: [employer], [employer]   Lot: [id number]   NDC: [address]-0

## 2019-12-28 ENCOUNTER — Other Ambulatory Visit: Payer: Self-pay | Admitting: Internal Medicine

## 2019-12-28 DIAGNOSIS — R413 Other amnesia: Secondary | ICD-10-CM

## 2020-01-06 ENCOUNTER — Ambulatory Visit
Admission: RE | Admit: 2020-01-06 | Discharge: 2020-01-06 | Disposition: A | Discharge status: Home / Self Care | Payer: Medicare Other | Source: Ambulatory Visit | Attending: Internal Medicine | Admitting: Internal Medicine

## 2020-01-06 ENCOUNTER — Other Ambulatory Visit: Payer: Self-pay

## 2020-01-06 DIAGNOSIS — R413 Other amnesia: Secondary | ICD-10-CM | POA: Insufficient documentation

## 2021-01-23 ENCOUNTER — Other Ambulatory Visit: Payer: Self-pay | Admitting: Family Medicine

## 2021-01-23 DIAGNOSIS — R519 Headache, unspecified: Secondary | ICD-10-CM

## 2021-02-02 ENCOUNTER — Ambulatory Visit: Admission: RE | Admit: 2021-02-02 | Payer: Medicare Other | Source: Ambulatory Visit

## 2021-02-06 ENCOUNTER — Other Ambulatory Visit: Payer: Self-pay | Admitting: Internal Medicine

## 2021-02-06 DIAGNOSIS — R519 Headache, unspecified: Secondary | ICD-10-CM

## 2021-02-13 ENCOUNTER — Ambulatory Visit
Admission: RE | Admit: 2021-02-13 | Discharge: 2021-02-13 | Disposition: A | Discharge status: Home / Self Care | Payer: Medicare Other | Source: Ambulatory Visit | Attending: Internal Medicine | Admitting: Internal Medicine

## 2021-02-13 ENCOUNTER — Other Ambulatory Visit: Payer: Self-pay | Admitting: Internal Medicine

## 2021-02-13 DIAGNOSIS — R519 Headache, unspecified: Secondary | ICD-10-CM | POA: Diagnosis not present

## 2021-02-19 ENCOUNTER — Ambulatory Visit: Payer: Medicare Other

## 2021-02-26 ENCOUNTER — Ambulatory Visit: Payer: Medicare Other | Attending: Neurology | Admitting: Physical Therapy

## 2021-02-26 ENCOUNTER — Other Ambulatory Visit: Payer: Self-pay

## 2021-02-26 DIAGNOSIS — M542 Cervicalgia: Secondary | ICD-10-CM | POA: Diagnosis present

## 2021-02-26 DIAGNOSIS — M2560 Stiffness of unspecified joint, not elsewhere classified: Secondary | ICD-10-CM | POA: Diagnosis present

## 2021-02-28 ENCOUNTER — Ambulatory Visit: Payer: Medicare Other | Admitting: Physical Therapy

## 2021-03-02 NOTE — Therapy (Deleted)
[facility] [address] [place], [place], [address] Phone: [phone]   Fax:  [phone]  Physical Therapy Evaluation  Patient Details  Name: Nayelly Laughman MRN: [id number] Date of Birth: July 15, 1945 Referring Provider (PT): Dr. Sherryll Burger   Encounter Date: 02/26/2021    PT End of Session - 03/02/21 1435     Visit Number 1    Number of Visits 9    Date for PT Re-Evaluation 03/26/21    Authorization - Visit Number 1    Authorization - Number of Visits 10    PT Start Time 1109    PT Stop Time 1207    PT Time Calculation (min) 58 min    Activity Tolerance Patient tolerated treatment well    Behavior During Therapy [facility] for tasks assessed/performed           No past medical history on file.  Past Surgical History:  Procedure Laterality Date   BREAST BIOPSY Right 1981   neg   BREAST EXCISIONAL BIOPSY       There were no vitals filed for this visit.    Subjective Assessment - 03/04/21 1317     Subjective Pt. referred to PT after experiencing extreme HAs at night/morning.  Pt. reports migraines have been worsening since mid-December.  Pt. had recent MRI (see scanned results).    Patient is accompained by: Family member    Pertinent History Pt. went to [facility] about 2.5 weeks ago.  Pt. reports feeling cold and has been sensitive to sound/ light.  Pts. husband reports that pt. has been having memory issues (short-term) which have worsened over past couple years.    Diagnostic tests MRI    Patient Stated Goals Decrease headaches/ neck pain.    Currently in Pain? Yes    Pain Location Head              Plan - 03/05/21 0735     Clinical Impression Statement Pt. is a pleasant 76 y/o female with c/o significant headaches over past couple months.  Pt. reports minimal pain currently at rest but has had significant episodes of head pain with no known MOI.  Pt. does reports worsening in memory loss.  Pt. is highly  motivated to exercise/ participate with daily household tasks.  FOTO: initial 65/ goal 72.  Seated cervical AROM: flexion (58 deg.), extension (72 deg.), L lat. (44 deg.), R lat. (46 deg.), L rotn. (52 deg.), R rotn. (65 deg.).  B shoulder strength grossly 5/5 MMT except B sh. abduction (4/5 MMT), ER/IR (4+/5 MMT).  Grip strength (R hand dominant): L 49.7#/ R 58.1#.  Pt. presents with good spinal mobility in upper cervical/ thoracic spine (L/R) in supine position.  Pt. will benefit from short-term skilled PT services to address neck/ head pain and improve B UE strength/ postural mobility.    Stability/Clinical Decision Making Evolving/Moderate complexity    Clinical Decision Making Moderate    Rehab Potential Good    PT Frequency 2x / week    PT Duration 4 weeks    PT Treatment/Interventions ADLs/Self Care Home Management;Electrical Stimulation;Cryotherapy;Moist Heat;Traction;Functional mobility training;Therapeutic activities;Neuromuscular re-education;Therapeutic exercise;Patient/family education;Manual techniques;Dry needling;Passive range of motion    PT Next Visit Plan Issue HEP             Patient will benefit from skilled therapeutic intervention in order to improve the following deficits and impairments:  Improper body mechanics, Decreased range of motion, Decreased activity tolerance, Decreased strength, Pain, Postural  dysfunction  Visit Diagnosis: Neck pain  Joint stiffness     Problem List There are no problems to display for this patient.   Cammie Mcgee, PT 03/05/2021, 7:59 AM   [facility] [address] [place], [place], [address] Phone: [phone]   Fax:  [phone]  Name: Saharah Sherrow MRN: [id number] Date of Birth: 10-28-1945

## 2021-03-04 ENCOUNTER — Encounter: Payer: Self-pay | Admitting: Physical Therapy

## 2021-03-05 ENCOUNTER — Ambulatory Visit: Payer: Medicare Other | Admitting: Physical Therapy

## 2021-03-05 ENCOUNTER — Encounter: Payer: Self-pay | Admitting: Physical Therapy

## 2021-03-05 ENCOUNTER — Other Ambulatory Visit: Payer: Self-pay

## 2021-03-05 DIAGNOSIS — M542 Cervicalgia: Secondary | ICD-10-CM

## 2021-03-05 DIAGNOSIS — M2560 Stiffness of unspecified joint, not elsewhere classified: Secondary | ICD-10-CM

## 2021-03-05 NOTE — Patient Instructions (Signed)
Access Code: [email or web address] URL: [email or web address] Date: 03/05/2021 Prepared by: Dorcas Carrow  Exercises Upper Trapezius Stretch - 2 x daily - 7 x weekly - 1 sets - 3 reps - 20 seconds hold Seated Levator Scapulae Stretch - 1 x daily - 7 x weekly - 1 sets - 3 reps - 20 seconds hold Seated Cervical Retraction - 1 x daily - 7 x weekly - 1 sets - 10 reps - 3-5 seconds hold Seated Scapular Retraction - 1 x daily - 7 x weekly - 1 sets - 20 reps - 3-5 seconds hold Doorway Pec Stretch at 90 Degrees Abduction - 1 x daily - 7 x weekly - 3 sets - 3 reps - 30 seconds hold

## 2021-03-05 NOTE — Therapy (Addendum)
[facility] [address] [place], [place], [address] Phone: [phone]   Fax:  [phone]  Physical Therapy Evaluation  Patient Details  Name: Jillian [name] MRN: [id number] Date of Birth: December 16, 1945 Referring Provider (PT): Dr. Sherryll Burger   Encounter Date: 02/26/2021    PT End of Session - 03/02/21 1435     Visit Number 1    Number of Visits 9    Date for PT Re-Evaluation 03/26/21    Authorization - Visit Number 1    Authorization - Number of Visits 10    PT Start Time 1109    PT Stop Time 1207    PT Time Calculation (min) 58 min    Activity Tolerance Patient tolerated treatment well    Behavior During Therapy [facility] for tasks assessed/performed           History reviewed. No pertinent past medical history.  Past Surgical History:  Procedure Laterality Date   BREAST BIOPSY Right 1981   neg   BREAST EXCISIONAL BIOPSY      There were no vitals filed for this visit.    Subjective Assessment - 03/04/21 1317     Subjective Pt. referred to PT after experiencing extreme HAs at night/morning.  Pt. reports migraines have been worsening since mid-December.  Pt. had recent MRI (see scanned results).    Patient is accompained by: Family member    Pertinent History Pt. went to [facility] about 2.5 weeks ago.  Pt. reports feeling cold and has been sensitive to sound/ light.  Pts. husband reports that pt. has been having memory issues (short-term) which have worsened over past couple years.    Diagnostic tests MRI    Patient Stated Goals Decrease headaches/ neck pain.    Currently in Pain? Yes    Pain Location Head                [facility] PT Assessment - 03/04/21 0001       Assessment   Medical Diagnosis Headache/ Neck Pain    Referring Provider (PT) Dr. Sherryll Burger    Onset Date/Surgical Date 02/03/21    Prior Therapy Not for HA or neck pain      Home Environment   Living Environment Private residence      Prior  Function   Level of Independence Independent      Cognition   Overall Cognitive Status Within Functional Limits for tasks assessed    Memory Impaired    Memory Impairment Decreased short term memory            See clinical impression  Slight forward head/ rounded shoulder posture in sitting.  Pt. Able to correct with cuing. Good spinal alignment/ cervical traction. No increase pain during palpation of B UT/ levator musculature.    Objective measurements completed on examination: See above findings.      PT Education - 03/04/21 1332     Education Details Discussed UT/levator/ cervical rotn. stretches and standing pec stretches at doorway.    Person(s) Educated Patient;Spouse    Methods Explanation;Demonstration    Comprehension Verbalized understanding;Returned demonstration                 PT Long Term Goals - 03/05/21 0749       PT LONG TERM GOAL #1   Title Pt. will increase FOTO to 72 to improve pain-free mobility.    Baseline Initial FOTO: 65    Time 4    Period Weeks  Status New    Target Date 03/26/21      PT LONG TERM GOAL #2   Title Pt. will increase cervical L rotn. to >65 deg. to improve pain-free mobility.    Baseline L cervical rotn. (52 deg.), R rotn. 65 deg.    Time 4    Period Weeks    Status New    Target Date 03/26/21      PT LONG TERM GOAL #3   Title Pt. will increase B shoulder strength to grossly 5/5 MMT with no increase c/o neck pain.    Baseline B shoulder strength grossly 5/5 MMT except B sh. abduction (4/5 MMT), ER/IR (4+/5 MMT). Grip strength (R hand dominant): L 49.7#/ R 58.1#.    Time 4    Period Weeks    Status New    Target Date 03/26/21      PT LONG TERM GOAL #4   Title Pt. will report no increase c/o headaches consistently for 1 week to improve sleeping/ ADL.    Baseline Pt. experiencing frequent migraines limiting sleep/ daily function    Time 4    Period Weeks    Status New    Target Date 03/26/21                Plan - 03/05/21 0735     Clinical Impression Statement Pt. is a pleasant 76 y/o female with c/o significant headaches over past couple months.  Pt. reports minimal pain currently at rest but has had significant episodes of head pain with no known MOI.  Pt. does reports worsening in memory loss.  Pt. is highly motivated to exercise/ participate with daily household tasks.  FOTO: initial 65/ goal 72.  Seated cervical AROM: flexion (58 deg.), extension (72 deg.), L lat. (44 deg.), R lat. (46 deg.), L rotn. (52 deg.), R rotn. (65 deg.).  B shoulder strength grossly 5/5 MMT except B sh. abduction (4/5 MMT), ER/IR (4+/5 MMT).  Grip strength (R hand dominant): L 49.7#/ R 58.1#.  Pt. presents with good spinal mobility in upper cervical/ thoracic spine (L/R) in supine position.  Pt. will benefit from short-term skilled PT services to address neck/ head pain and improve B UE strength/ postural mobility.    Stability/Clinical Decision Making Evolving/Moderate complexity    Clinical Decision Making Moderate    Rehab Potential Good    PT Frequency 2x / week    PT Duration 4 weeks    PT Treatment/Interventions ADLs/Self Care Home Management;Electrical Stimulation;Cryotherapy;Moist Heat;Traction;Functional mobility training;Therapeutic activities;Neuromuscular re-education;Therapeutic exercise;Patient/family education;Manual techniques;Dry needling;Passive range of motion    PT Next Visit Plan Issue HEP             Patient will benefit from skilled therapeutic intervention in order to improve the following deficits and impairments:  Improper body mechanics, Decreased range of motion, Decreased activity tolerance, Decreased strength, Pain, Postural dysfunction  Visit Diagnosis: Neck pain  Joint stiffness     Problem List There are no problems to display for this patient.  Cammie Mcgee, PT, DPT # [phone] 03/05/2021, 8:00 AM  [facility] [address] [place], [place], [address] Phone: [phone]   Fax:  [phone]  Name: Jillian [name] MRN: [id number] Date of Birth: [birth year]

## 2021-03-05 NOTE — Addendum Note (Signed)
Addended by: Cammie Mcgee on: 03/05/2021 08:08 AM   Modules accepted: Orders

## 2021-03-05 NOTE — Therapy (Signed)
[facility] [address] [place], [place], [address] Phone: [phone]   Fax:  [phone]  Physical Therapy Treatment  Patient Details  Name: Jillian [name] MRN: [id number] Date of Birth: 11/20/1945 Referring Provider (PT): Dr. Sherryll Burger   Encounter Date: 03/05/2021   PT End of Session - 03/05/21 1836     Visit Number 2    Number of Visits 9    Date for PT Re-Evaluation 03/26/21    Authorization - Visit Number 2    Authorization - Number of Visits 10    PT Start Time 1106    PT Stop Time 1205    PT Time Calculation (min) 59 min    Activity Tolerance Patient tolerated treatment well    Behavior During Therapy [facility] for tasks assessed/performed             History reviewed. No pertinent past medical history.  Past Surgical History:  Procedure Laterality Date   BREAST BIOPSY Right 1981   neg   BREAST EXCISIONAL BIOPSY      There were no vitals filed for this visit.   Subjective Assessment - 03/05/21 1834     Subjective Pt. had a f/u with Dr. Margaretmary Eddy office due to significant migraine last week.  See MD notes.  MD recommends TDN and Cefaly device    Patient is accompained by: Family member    Pertinent History Pt. went to [place] about 2.5 weeks ago.  Pt. reports feeling cold and has been sensitive to sound/ light.  Pts. husband reports that pt. has been having memory issues (short-term) which have worsened over past couple years.    Diagnostic tests MRI    Patient Stated Goals Decrease headaches/ neck pain.    Currently in Pain? Yes    Pain Score 5     Pain Location Head    Pain Orientation Anterior    Pain Type Chronic pain             Manual tx.:  See HEP/ reviewed  Supine UT/levator manual stretches 3x each with 20 sec. Static holds. Supine manual traction/ suboccipital release 3x with holds ("feels good") Supine/ prone STM to B UT/ cervical paraspinals prior to TDN.  TDN to cervical paraspinals/  suboccipital trigger points in prone position with 0.25x40 mm needles.  2 needles used and PT reviewed precautions/ contraindications to pt./ husband.  Pistoning technique to suboccipital region with no muscle fasciculations noted.  MH to upper thoracic/ low cervical region during TDN.  E-stim:  Supine position: EMPI continuum unit with 1 channel (2 electrodes) to forehead.  Parameters: 20 min./ rate 60 Hz/ width 252 microsec./ sym waveform/ 3.5-4.5 mA     PT Education - 03/05/21 1836     Education Details Access Code: [email or web address]    Person(s) Educated Patient    Methods Explanation;Demonstration;Handout    Comprehension Verbalized understanding;Returned demonstration                 PT Long Term Goals - 03/05/21 0749       PT LONG TERM GOAL #1   Title Pt. will increase FOTO to 72 to improve pain-free mobility.    Baseline Initial FOTO: 65    Time 4    Period Weeks    Status New    Target Date 03/26/21      PT LONG TERM GOAL #2   Title Pt. will increase cervical L rotn. to >65 deg. to improve pain-free mobility.  Baseline L cervical rotn. (52 deg.), R rotn. 65 deg.    Time 4    Period Weeks    Status New    Target Date 03/26/21      PT LONG TERM GOAL #3   Title Pt. will increase B shoulder strength to grossly 5/5 MMT with no increase c/o neck pain.    Baseline B shoulder strength grossly 5/5 MMT except B sh. abduction (4/5 MMT), ER/IR (4+/5 MMT). Grip strength (R hand dominant): L 49.7#/ R 58.1#.    Time 4    Period Weeks    Status New    Target Date 03/26/21      PT LONG TERM GOAL #4   Title Pt. will report no increase c/o headaches consistently for 1 week to improve sleeping/ ADL.    Baseline Pt. experiencing frequent migraines limiting sleep/ daily function    Time 4    Period Weeks    Status New    Target Date 03/26/21                Plan - 03/05/21 1837     Clinical Impression Statement Pt. tolerates TDN to suboccipital region in prone position  and supine e-stim to forehead with similar parameters of Cefaly device.  Pt. reports a good UT/ levator stretch in supine position and benefit from manual traction/ suboccipital release.  Pt. will continue to focus on HEP and will purchase a TENS unit to simulate Cefaly device for HA management at home.  Pt. will continue to benefit from skilled PT services to improve pain-free mobility.    Stability/Clinical Decision Making Evolving/Moderate complexity    Clinical Decision Making Moderate    Rehab Potential Good    PT Frequency 2x / week    PT Duration 4 weeks    PT Treatment/Interventions ADLs/Self Care Home Management;Electrical Stimulation;Cryotherapy;Moist Heat;Traction;Functional mobility training;Therapeutic activities;Neuromuscular re-education;Therapeutic exercise;Patient/family education;Manual techniques;Dry needling;Passive range of motion    PT Next Visit Plan Reassess HA and use of e-stim/ TDN    PT Home Exercise Plan Access Code: [email or web address]             Patient will benefit from skilled therapeutic intervention in order to improve the following deficits and impairments:  Improper body mechanics, Decreased range of motion, Decreased activity tolerance, Decreased strength, Pain, Postural dysfunction  Visit Diagnosis: Neck pain  Joint stiffness     Problem List There are no problems to display for this patient.  Cammie Mcgee, PT, DPT # [phone] 03/05/2021, 6:40 PM  [place] [facility] [address] [place], [place], [address] Phone: [phone]   Fax:  [phone]  Name: Jillian [name] MRN: [id number] Date of Birth: May 08, 1945

## 2021-03-07 ENCOUNTER — Ambulatory Visit: Payer: Medicare Other | Attending: Neurology | Admitting: Physical Therapy

## 2021-03-07 ENCOUNTER — Other Ambulatory Visit: Payer: Self-pay

## 2021-03-07 DIAGNOSIS — M542 Cervicalgia: Secondary | ICD-10-CM | POA: Insufficient documentation

## 2021-03-07 DIAGNOSIS — M2560 Stiffness of unspecified joint, not elsewhere classified: Secondary | ICD-10-CM | POA: Diagnosis present

## 2021-03-10 NOTE — Therapy (Signed)
[facility] [address] [place], [place], [address] Phone: [phone]   Fax:  [phone]  Physical Therapy Treatment  Patient Details  Name: Jillian [name] MRN: [id number] Date of Birth: [birth year] Referring Provider (PT): Dr. Manuella Ghazi   Encounter Date: 03/07/2021   PT End of Session - 03/10/21 0939     Visit Number 3    Number of Visits 9    Date for PT Re-Evaluation 03/26/21    Authorization - Visit Number 3    Authorization - Number of Visits 10    PT Start Time 1026    PT Stop Time 1119    PT Time Calculation (min) 53 min    Activity Tolerance Patient tolerated treatment well    Behavior During Therapy [facility] for tasks assessed/performed             No past medical history on file.  Past Surgical History:  Procedure Laterality Date   BREAST BIOPSY Right 1981   neg   BREAST EXCISIONAL BIOPSY      There were no vitals filed for this visit.   Subjective Assessment - 03/10/21 0932     Subjective Pt. reports 4/10 pain in head prior to tx. session.  Pt. states pain was worse yesterday.  Pt. reports BP was high this morning but vitals WNL prior to PT tx. session.  Pt. brought in new TENS unit to simulate a Cefaly device for trigeminal pain self-management at home.    Patient is accompained by: Family member    Pertinent History Pt. went to [facility] about 2.5 weeks ago.  Pt. reports feeling cold and has been sensitive to sound/ light.  Pts. husband reports that pt. has been having memory issues (short-term) which have worsened over past couple years.    Diagnostic tests MRI    Patient Stated Goals Decrease headaches/ neck pain.    Currently in Pain? Yes    Pain Score 4     Pain Location Head    Pain Orientation Anterior               Manual tx.:   Supine UT/levator manual stretches 3x each with 20 sec. Static holds. Supine manual traction/ suboccipital release 3x with holds ("feels good") Supine/ prone  STM to B UT/ cervical paraspinals prior to TDN.   TDN to cervical paraspinals/ suboccipital trigger points in prone position with 0.25x40 mm needles.  2 needles used and PT reviewed precautions/ contraindications to pt./ husband.  Pistoning technique to suboccipital region with no muscle fasciculations noted.  MH to upper thoracic/ low cervical region during TDN.   E-stim:   Pt. Brought in personal unit and PT set parameters to simulate Cefaly device for home use.  Pt./husband educate on proper placement of electrodes/ parameters for tx.   Supine position: Personal home TENS unit with 1 channel (2 electrodes) to forehead.  Parameters: 20 min./ rate 60 Hz/ width 250 microsec./ sym waveform/ 2.0-2.59mA         PT Education - 03/10/21 0938     Education Details Educated on proper set-up of electrodes/ parameters of TENS unit    Person(s) Educated Patient;Spouse    Methods Explanation;Demonstration;Handout    Comprehension Verbalized understanding;Returned demonstration                 PT Long Term Goals - 03/05/21 0749       PT LONG TERM GOAL #1   Title Pt. will increase FOTO to  72 to improve pain-free mobility.    Baseline Initial FOTO: 65    Time 4    Period Weeks    Status New    Target Date 03/26/21      PT LONG TERM GOAL #2   Title Pt. will increase cervical L rotn. to >65 deg. to improve pain-free mobility.    Baseline L cervical rotn. (52 deg.), R rotn. 65 deg.    Time 4    Period Weeks    Status New    Target Date 03/26/21      PT LONG TERM GOAL #3   Title Pt. will increase B shoulder strength to grossly 5/5 MMT with no increase c/o neck pain.    Baseline B shoulder strength grossly 5/5 MMT except B sh. abduction (4/5 MMT), ER/IR (4+/5 MMT). Grip strength (R hand dominant): L 49.7#/ R 58.1#.    Time 4    Period Weeks    Status New    Target Date 03/26/21      PT LONG TERM GOAL #4   Title Pt. will report no increase c/o headaches consistently for 1 week  to improve sleeping/ ADL.    Baseline Pt. experiencing frequent migraines limiting sleep/ daily function    Time 4    Period Weeks    Status New    Target Date 03/26/21                   Plan - 03/10/21 0940     Clinical Impression Statement Excellent tx. tolerance with supine manual cervical stretches/ traction with no increase in pain symptoms.  TDN to subocciptial region in prone with good tolerance/ no muscle fasciculations noted.  Pt./ husband educated in use of home TENS for pain mgmt. on a daily basis.  Pt. and husband demonstrate a good understanding of TENS and electrode placement.  Pt. instructed to contact PT if any questions of concerns.    Stability/Clinical Decision Making Evolving/Moderate complexity    Clinical Decision Making Moderate    Rehab Potential Good    PT Frequency 2x / week    PT Duration 4 weeks    PT Treatment/Interventions ADLs/Self Care Home Management;Electrical Stimulation;Cryotherapy;Moist Heat;Traction;Functional mobility training;Therapeutic activities;Neuromuscular re-education;Therapeutic exercise;Patient/family education;Manual techniques;Dry needling;Passive range of motion    PT Next Visit Plan Reassess use of home TENS and add strengthening ex. to HEP    PT Home Exercise Plan Access Code: [email or web address]             Patient will benefit from skilled therapeutic intervention in order to improve the following deficits and impairments:  Improper body mechanics, Decreased range of motion, Decreased activity tolerance, Decreased strength, Pain, Postural dysfunction  Visit Diagnosis: Neck pain  Joint stiffness     Problem List There are no problems to display for this patient.  Pura Spice, PT, DPT # [phone] 03/10/2021, 9:47 AM  [facility] [address] [place], [place], [address] Phone: [phone]   Fax:  [phone]  Name: Jillian [name] MRN: [id number] Date of Birth:  [birth year]

## 2021-03-12 ENCOUNTER — Ambulatory Visit: Payer: Medicare Other | Admitting: Physical Therapy

## 2021-03-12 ENCOUNTER — Other Ambulatory Visit: Payer: Self-pay

## 2021-03-12 DIAGNOSIS — M542 Cervicalgia: Secondary | ICD-10-CM | POA: Diagnosis not present

## 2021-03-12 DIAGNOSIS — M2560 Stiffness of unspecified joint, not elsewhere classified: Secondary | ICD-10-CM

## 2021-03-14 ENCOUNTER — Ambulatory Visit: Payer: Medicare Other | Admitting: Physical Therapy

## 2021-03-14 ENCOUNTER — Other Ambulatory Visit: Payer: Self-pay

## 2021-03-14 DIAGNOSIS — M542 Cervicalgia: Secondary | ICD-10-CM

## 2021-03-14 DIAGNOSIS — M2560 Stiffness of unspecified joint, not elsewhere classified: Secondary | ICD-10-CM

## 2021-03-16 NOTE — Therapy (Signed)
[facility] [address] [place], [place], [address] Phone: [phone]   Fax:  [phone]  Physical Therapy Treatment  Patient Details  Name: Briya Lookabaugh MRN: [id number] Date of Birth: 1945/12/17 Referring Provider (PT): Dr. Sherryll Burger   Encounter Date: 03/14/2021   PT End of Session - 03/16/21 1245     Visit Number 5    Number of Visits 9    Date for PT Re-Evaluation 03/26/21    Authorization - Visit Number 5    Authorization - Number of Visits 10    PT Start Time 1116    PT Stop Time 1202    PT Time Calculation (min) 46 min    Activity Tolerance Patient tolerated treatment well    Behavior During Therapy [facility] for tasks assessed/performed             No past medical history on file.  Past Surgical History:  Procedure Laterality Date   BREAST BIOPSY Right 1981   neg   BREAST EXCISIONAL BIOPSY      There were no vitals filed for this visit.   Subjective Assessment - 03/16/21 1240     Subjective Pt. states she had a difficult day with migraine yesterday.  Pt. has been weaning off caffeine/ chocolate and is more aware of triggers.  Pt. reports BP systolic has remained high recently.    Patient is accompained by: Family member    Pertinent History Pt. went to [facility] about 2.5 weeks ago.  Pt. reports feeling cold and has been sensitive to sound/ light.  Pts. husband reports that pt. has been having memory issues (short-term) which have worsened over past couple years.    Diagnostic tests MRI    Patient Stated Goals Decrease headaches/ neck pain.    Currently in Pain? Yes    Pain Score 5     Pain Location Head    Pain Orientation Anterior               Manual tx.:   Supine UT/levator manual stretches 3x each with 20 sec. Static holds. Supine manual traction/ suboccipital release 3x with holds ("feels good") Supine/ prone STM to B UT/ cervical paraspinals prior to TDN.   TDN to cervical paraspinals/  suboccipital trigger points in prone position with 0.25x40 mm needles.  2 needles used and PT reviewed precautions/ contraindications to pt./ husband.  Pistoning technique to suboccipital region with no muscle fasciculations noted.     E-stim:   Prone position: Personal home TENS unit with 2 channel (4-electrodes) to forehead.  Parameters: 20 min./ rate 60 Hz/ width 100 microsec./ sym waveform/ 2.5-3.55mA.  MH to upper thoracic/ low cervical region during TDN.        PT Long Term Goals - 03/05/21 0749       PT LONG TERM GOAL #1   Title Pt. will increase FOTO to 72 to improve pain-free mobility.    Baseline Initial FOTO: 65    Time 4    Period Weeks    Status New    Target Date 03/26/21      PT LONG TERM GOAL #2   Title Pt. will increase cervical L rotn. to >65 deg. to improve pain-free mobility.    Baseline L cervical rotn. (52 deg.), R rotn. 65 deg.    Time 4    Period Weeks    Status New    Target Date 03/26/21      PT LONG TERM GOAL #  3   Title Pt. will increase B shoulder strength to grossly 5/5 MMT with no increase c/o neck pain.    Baseline B shoulder strength grossly 5/5 MMT except B sh. abduction (4/5 MMT), ER/IR (4+/5 MMT). Grip strength (R hand dominant): L 49.7#/ R 58.1#.    Time 4    Period Weeks    Status New    Target Date 03/26/21      PT LONG TERM GOAL #4   Title Pt. will report no increase c/o headaches consistently for 1 week to improve sleeping/ ADL.    Baseline Pt. experiencing frequent migraines limiting sleep/ daily function    Time 4    Period Weeks    Status New    Target Date 03/26/21                   Plan - 03/16/21 1247     Clinical Impression Statement Pt. continues to present with good cervical AROM (all planes) and UE strength WNL.  PT focused on STM/ manual stretches in supine/ prone position.  Parallel electrode/sensorey TENS set up to B cervical/ upper thoracic paraspinals in prone with MH after tx.  Pts. BP showed marked  improvement (120/66) after tx. session.  Pt. will continue to focus on current HEP and instructed to contact PT if any questions or issues.    Stability/Clinical Decision Making Evolving/Moderate complexity    Clinical Decision Making Moderate    Rehab Potential Good    PT Frequency 2x / week    PT Duration 4 weeks    PT Treatment/Interventions ADLs/Self Care Home Management;Electrical Stimulation;Cryotherapy;Moist Heat;Traction;Functional mobility training;Therapeutic activities;Neuromuscular re-education;Therapeutic exercise;Patient/family education;Manual techniques;Dry needling;Passive range of motion    PT Next Visit Plan REASSESS GOALS.    PT Home Exercise Plan Access Code: [email or web address]             Patient will benefit from skilled therapeutic intervention in order to improve the following deficits and impairments:  Improper body mechanics, Decreased range of motion, Decreased activity tolerance, Decreased strength, Pain, Postural dysfunction  Visit Diagnosis: Neck pain  Joint stiffness     Problem List There are no problems to display for this patient.  Cammie Mcgee, PT, DPT # [phone] 03/16/2021, 1:03 PM  [facility] [address] [place], [place], [address] Phone: [phone]   Fax:  [phone]  Name: Breezie Micucci MRN: [id number] Date of Birth: 1945/09/24

## 2021-03-16 NOTE — Therapy (Signed)
[facility] [address] [place], [place], [address] Phone: [phone]   Fax:  [phone]  Physical Therapy Treatment  Patient Details  Name: Aniayah Alaniz MRN: [id number] Date of Birth: 06-18-1945 Referring Provider (PT): Dr. Sherryll Burger   Encounter Date: 03/12/2021   PT End of Session - 03/16/21 1156     Visit Number 4    Number of Visits 9    Date for PT Re-Evaluation 03/26/21    Authorization - Visit Number 4    Authorization - Number of Visits 10    PT Start Time 1106    PT Stop Time 1200    PT Time Calculation (min) 54 min    Activity Tolerance Patient tolerated treatment well    Behavior During Therapy [facility] for tasks assessed/performed             No past medical history on file.  Past Surgical History:  Procedure Laterality Date   BREAST BIOPSY Right 1981   neg   BREAST EXCISIONAL BIOPSY      There were no vitals filed for this visit.   Subjective Assessment - 03/16/21 1152     Subjective Pt. reports she has been using home TENS as recommended but does not look forward to it due to discomfort.  PT changed pulse duration to decrease discomfort/ improve tx. tolerance.  Pt. reports BP has continued to be high and HA worsen has day progresses.    Patient is accompained by: Family member    Pertinent History Pt. went to [facility] about 2.5 weeks ago.  Pt. reports feeling cold and has been sensitive to sound/ light.  Pts. husband reports that pt. has been having memory issues (short-term) which have worsened over past couple years.    Diagnostic tests MRI    Patient Stated Goals Decrease headaches/ neck pain.    Currently in Pain? Yes    Pain Score 4     Pain Location Head    Pain Orientation Anterior              There.ex.:  Seated UE ex. At [employer]: 40# lat. Pull downs/ 30# tricep extension/ 30# sh. Adduction (handles)/ 30# scap. Retraction 15x2 each.  Good upright posture/ technique.  No increase  c/o pain.   Discussed HEP.    Manual tx.:   Supine UT/levator manual stretches 3x each with 20 sec. Static holds. Supine manual traction/ suboccipital release 3x with holds ("feels good") Supine/ prone STM to B UT/ cervical paraspinals prior to TDN.   No TDN today   E-stim:   Supine position: Personal home TENS unit with 1 channel (2 electrodes) to forehead.  Parameters: 20 min./ rate 60 Hz/ width 100 microsec. (Adjusted from previous visit)/ sym waveform/ 2.0-2.5mA       PT Long Term Goals - 03/05/21 0749       PT LONG TERM GOAL #1   Title Pt. will increase FOTO to 72 to improve pain-free mobility.    Baseline Initial FOTO: 65    Time 4    Period Weeks    Status New    Target Date 03/26/21      PT LONG TERM GOAL #2   Title Pt. will increase cervical L rotn. to >65 deg. to improve pain-free mobility.    Baseline L cervical rotn. (52 deg.), R rotn. 65 deg.    Time 4    Period Weeks    Status New    Target Date  03/26/21      PT LONG TERM GOAL #3   Title Pt. will increase B shoulder strength to grossly 5/5 MMT with no increase c/o neck pain.    Baseline B shoulder strength grossly 5/5 MMT except B sh. abduction (4/5 MMT), ER/IR (4+/5 MMT). Grip strength (R hand dominant): L 49.7#/ R 58.1#.    Time 4    Period Weeks    Status New    Target Date 03/26/21      PT LONG TERM GOAL #4   Title Pt. will report no increase c/o headaches consistently for 1 week to improve sleeping/ ADL.    Baseline Pt. experiencing frequent migraines limiting sleep/ daily function    Time 4    Period Weeks    Status New    Target Date 03/26/21                   Plan - 03/16/21 1157     Clinical Impression Statement PT discussed various triggers for migraines and issued education/ book on topic.  No TDN today with focus on manual stretches/ STM.  Pt. presents with good cervical AROM and no increase pain reported during tx. session.  Excellent technique with B UE/ posture  strengthening ex. program at [employer].  PT will continue with current tx. plan and continue with exercise program at home.    Stability/Clinical Decision Making Evolving/Moderate complexity    Clinical Decision Making Moderate    Rehab Potential Good    PT Frequency 2x / week    PT Duration 4 weeks    PT Treatment/Interventions ADLs/Self Care Home Management;Electrical Stimulation;Cryotherapy;Moist Heat;Traction;Functional mobility training;Therapeutic activities;Neuromuscular re-education;Therapeutic exercise;Patient/family education;Manual techniques;Dry needling;Passive range of motion    PT Next Visit Plan Reassess use of home TENS and adjust pulse durantion as tolerated.  Discuss migraine book/ information.    PT Home Exercise Plan Access Code: [email or web address]             Patient will benefit from skilled therapeutic intervention in order to improve the following deficits and impairments:  Improper body mechanics, Decreased range of motion, Decreased activity tolerance, Decreased strength, Pain, Postural dysfunction  Visit Diagnosis: Neck pain  Joint stiffness     Problem List There are no problems to display for this patient.  Cammie Mcgee, PT, DPT # [phone] 03/16/2021, 12:11 PM  [place] [facility] [address] [place], [place], [address] Phone: [phone]   Fax:  [phone]  Name: Antonae Zbikowski MRN: [id number] Date of Birth: 02-04-1945

## 2021-03-19 ENCOUNTER — Ambulatory Visit: Payer: Medicare Other | Admitting: Physical Therapy

## 2021-03-21 ENCOUNTER — Ambulatory Visit: Payer: Medicare Other | Admitting: Physical Therapy

## 2021-03-26 ENCOUNTER — Encounter: Payer: Medicare Other | Admitting: Physical Therapy

## 2021-03-28 ENCOUNTER — Encounter: Payer: Medicare Other | Admitting: Physical Therapy

## 2021-04-02 ENCOUNTER — Encounter: Payer: Medicare Other | Admitting: Physical Therapy

## 2021-04-04 ENCOUNTER — Encounter: Payer: Medicare Other | Admitting: Physical Therapy

## 2021-04-29 ENCOUNTER — Other Ambulatory Visit: Payer: Self-pay | Admitting: Internal Medicine

## 2021-04-29 ENCOUNTER — Other Ambulatory Visit (HOSPITAL_COMMUNITY): Payer: Self-pay | Admitting: Internal Medicine

## 2021-04-29 DIAGNOSIS — R519 Headache, unspecified: Secondary | ICD-10-CM

## 2021-05-02 ENCOUNTER — Ambulatory Visit
Admission: RE | Admit: 2021-05-02 | Discharge: 2021-05-02 | Disposition: A | Discharge status: Home / Self Care | Payer: Medicare Other | Source: Ambulatory Visit | Attending: Internal Medicine | Admitting: Internal Medicine

## 2021-05-02 DIAGNOSIS — R519 Headache, unspecified: Secondary | ICD-10-CM | POA: Insufficient documentation

## 2021-05-02 MED ORDER — GADOBUTROL 1 MMOL/ML IV SOLN
6.0000 mL | Freq: Once | INTRAVENOUS | Status: AC | PRN
  Administered 2021-05-02: 6 mL via INTRAVENOUS

## 2021-10-16 ENCOUNTER — Ambulatory Visit: Payer: Medicare Other | Attending: Orthopedic Surgery | Admitting: Physical Therapy

## 2021-10-16 ENCOUNTER — Encounter: Payer: Self-pay | Admitting: Physical Therapy

## 2021-10-16 DIAGNOSIS — M542 Cervicalgia: Secondary | ICD-10-CM | POA: Insufficient documentation

## 2021-10-16 DIAGNOSIS — M6281 Muscle weakness (generalized): Secondary | ICD-10-CM | POA: Insufficient documentation

## 2021-10-16 DIAGNOSIS — M2560 Stiffness of unspecified joint, not elsewhere classified: Secondary | ICD-10-CM | POA: Insufficient documentation

## 2021-10-16 DIAGNOSIS — M25562 Pain in left knee: Secondary | ICD-10-CM | POA: Insufficient documentation

## 2021-10-16 NOTE — Therapy (Signed)
OUTPATIENT PHYSICAL THERAPY LOWER EXTREMITY EVALUATION   Patient Name: Jillian [name] MRN: [id number] DOB:[birth year], 76 y.o., female Today's Date: 10/16/2021   PT End of Session - 10/16/21 1250     Visit Number 1    Number of Visits 9    Date for PT Re-Evaluation 11/13/21    Authorization - Visit Number 1    Authorization - Number of Visits 10    PT Start Time 1250    Activity Tolerance Patient tolerated treatment well    Behavior During Therapy [facility] for tasks assessed/performed            1250 to 1346 (56 minutes)   History reviewed. No pertinent past medical history. 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 Reports increase in L knee pain around suprapatellar/infrapatellar tendon.  Pt. Reports she may have aggravated L knee during leg press at gym.  Pt. Enjoys walking 1 hour on TM at home and participating with ex. Program.  Pt. Reports 0/10 pain at rest and 5/10 at worst with movement.    PERTINENT HISTORY: See MD notes.  Pt. Known well to PT clinic.   Viviano Simas [place], [place] - 09/30/2021 2:00 PM EDT Formatting of this note is different from the original. Images from the original note were not included. Chief Complaint Chief Complaint  Patient presents with  Left Knee - Pain   Reason for Visit The patient is a 76 year old female that presented for complaints of her left knee. For several weeks now she has been having some increased pain in the front surface of the knee and her quadriceps. She had been doing some strengthening exercises with a leg press at the gym.  She also walks on the treadmill on an incline. She has anterior knee pain on the left with no pain on the right. She has been taking Tylenol with no relief. She did try occasional naproxen. She has no popping or giving out and no locking in the knee. The patient is not a diabetic.   PAIN:  Are you having pain? Yes: NPRS scale: 0/10 Pain location: L knee Pain description: aching/ persistent Aggravating factors: walking/ movement Relieving factors: rest  PRECAUTIONS: None  WEIGHT BEARING RESTRICTIONS No  FALLS:  Has patient fallen in last 6 months? No  LIVING ENVIRONMENT: Lives with: lives with their spouse Lives in: House/apartment Stairs: Pt. Has pain with descending stairs.     OCCUPATION: retired  PLOF: Independent  PATIENT GOALS:  Decrease L knee pain/ return to gym   OBJECTIVE:   DIAGNOSTIC FINDINGS: Negative x-ray  PATIENT SURVEYS:  FOTO initial 59/ goal 87  COGNITION:  Overall cognitive status: Within functional limits for tasks assessed     SENSATION: WFL  EDEMA:  No swelling noted in L knee as compared to R.  Good joint line/ spacing  MUSCLE LENGTH: Hamstrings: Right WNL; Left WNL   POSTURE: Slight rounded shoulders and forward head  PALPATION: (+) L knee tenderness over infrapatellar tendon and posterior aspect of knee (?Bakers cyst).    LOWER EXTREMITY ROM:  B hip/knee AROM WNL.  L knee AROM in supine: 0-145 deg.    LOWER EXTREMITY MMT:  B LE muscle strength grossly 5/5 MMT except B hip flexion 4/5 MMT, B hip abduction 4+/5 MMT and L knee extension 4+/5 MMT (increase in pain).    LOWER EXTREMITY SPECIAL TESTS:  Knee special tests: Anterior drawer test: negative, Posterior drawer test: negative, McMurray's test: negative, and Patellafemoral apprehension test: positive    GAIT: Distance walked: in clinic Assistive device utilized: None Level of assistance: Complete Independence Comments: normalized gait pattern.  Increase in L knee pain  during eccentric quad control while descending stairs.      TODAY'S TREATMENT: Evaluation.  Will email HEP.  Scifit L6 10 min. (Pain tolerable range).     PATIENT EDUCATION:  Education details: SLR/ hip abduction/ quad sets Person educated: Patient and Spouse Education method: Explanation, Facilities manager, and Handouts Education comprehension: verbalized understanding and returned demonstration   HOME EXERCISE PROGRAM: SLR/ hip abduction in sidelying/ tendon loading program.   ASSESSMENT:  CLINICAL IMPRESSION: Patient is a pleasant 76 y.o. female who was seen today for physical therapy evaluation and treatment for L PFPS.  Pt. Presents with no L knee pain at rest and marked increase in L knee discomfort with increase walking/ descending stairs.  Pt. Has excellent joint mobility/ LE flexibility and minimal strength deficits.  Pt. Will benefit from modification to gym based/HEP and skilled PT to improve functional mobility/ pain-free walking.     OBJECTIVE IMPAIRMENTS decreased activity tolerance, decreased endurance, decreased mobility, difficulty walking, decreased ROM, impaired flexibility, and pain.   ACTIVITY LIMITATIONS lifting, bending, squatting, and locomotion level  PARTICIPATION LIMITATIONS: community activity and yard work  PERSONAL FACTORS Fitness and Past/current experiences are also affecting patient's functional outcome.   REHAB POTENTIAL: Excellent  CLINICAL DECISION MAKING: Stable/uncomplicated  EVALUATION COMPLEXITY: Low   GOALS: Goals reviewed with patient? Yes  SHORT TERM GOALS: Target date: 10/30/21 Pt. Will be independent with HEP to increase B LE muscle strength/ return to gym based ex.  Baseline:  currently not participating with gym Goal status: INITIAL   LONG TERM GOALS: Target date: 11/13/21  Pt. Will increase FOTO to 72 to improve pain-free mobility. Baseline: initial 59 Goal status: INITIAL  2.  Pt. Will increase B hip/ L quad strength  1/2 muscle grade to improve pain-free mobility/ return to gym.   Baseline:  See above Goal status: INITIAL  3.  Pt. Able to descend stairs with no c/o L knee pain to improve functional mobility.  Baseline: L knee pain during eccentric muscle control on L knee during R LE step downs.  Goal status: INITIAL    PLAN: PT FREQUENCY: 2x/week  PT DURATION: 4 weeks  PLANNED INTERVENTIONS: Therapeutic exercises, Therapeutic activity, Neuromuscular re-education, Balance training, Gait training, Patient/Family education, Self Care, Joint mobilization, Taping, Manual therapy, and Re-evaluation  PLAN FOR NEXT SESSION: Reassess pain/ tenderness at L knee.  Review HEP  Cammie Mcgee, PT, DPT # [phone] 10/16/2021, 12:53 PM

## 2021-10-18 ENCOUNTER — Ambulatory Visit: Payer: Medicare Other | Admitting: Physical Therapy

## 2021-10-18 DIAGNOSIS — M25562 Pain in left knee: Secondary | ICD-10-CM

## 2021-10-18 DIAGNOSIS — M6281 Muscle weakness (generalized): Secondary | ICD-10-CM

## 2021-10-23 ENCOUNTER — Ambulatory Visit: Payer: Medicare Other | Admitting: Physical Therapy

## 2021-10-23 DIAGNOSIS — M6281 Muscle weakness (generalized): Secondary | ICD-10-CM

## 2021-10-23 DIAGNOSIS — M25562 Pain in left knee: Secondary | ICD-10-CM

## 2021-10-25 ENCOUNTER — Ambulatory Visit: Payer: Medicare Other | Admitting: Physical Therapy

## 2021-10-25 DIAGNOSIS — M542 Cervicalgia: Secondary | ICD-10-CM

## 2021-10-25 DIAGNOSIS — M25562 Pain in left knee: Secondary | ICD-10-CM | POA: Diagnosis not present

## 2021-10-25 DIAGNOSIS — M6281 Muscle weakness (generalized): Secondary | ICD-10-CM

## 2021-10-25 DIAGNOSIS — M2560 Stiffness of unspecified joint, not elsewhere classified: Secondary | ICD-10-CM

## 2021-10-26 NOTE — Therapy (Signed)
OUTPATIENT PHYSICAL THERAPY LOWER EXTREMITY TREATMENT   Patient Name: Jillian [name] MRN: [id number] DOB:August 31, 1945, 76 y.o., female Today's Date: 10/23/2021   PT End of Session - 10/26/21 1315     Visit Number 3    Number of Visits 9    Date for PT Re-Evaluation 11/13/21    Authorization - Visit Number 3    Authorization - Number of Visits 10    PT Start Time [id number]    PT Stop Time 1346    PT Time Calculation (min) 52 min    Activity Tolerance Patient tolerated treatment well    Behavior During Therapy [facility] for tasks assessed/performed             No past medical history on file. Past Surgical History:  Procedure Laterality Date   BREAST BIOPSY Right 1981   neg   BREAST EXCISIONAL BIOPSY       PCP: Adin Hector, MD  REFERRING PROVIDER: Reche Dixon, PA-C  REFERRING DIAG: Patellofemoral syndrome of left knee  THERAPY DIAG:  Acute left knee pain Muscle weakness  Rationale for Evaluation and Treatment Rehabilitation  ONSET DATE: Few weeks ago (09/17/21)  SUBJECTIVE:   SUBJECTIVE STATEMENT: Pt. Reports increase in L knee pain around suprapatellar/infrapatellar tendon.  Pt. Reports she may have aggravated L knee during leg press at gym.  Pt. Enjoys walking 1 hour on TM at home and participating with ex. Program.  Pt. Reports 0/10 pain at rest and 5/10 at worst with movement.    PERTINENT HISTORY: See MD notes.  Pt. Known well to PT clinic.   Hampton Abbot [place], [place] - 09/30/2021 2:00 PM EDT Formatting of this note is different from the original. Images from the original note were not included. Chief Complaint Chief Complaint  Patient presents with  Left Knee - Pain   Reason for Visit The patient is a 76 year old female that presented for complaints of her left knee. For several weeks now she has been having some increased pain in the front surface of the knee and her quadriceps. She had been doing some strengthening exercises with a leg press at  the gym. She also walks on the treadmill on an incline. She has anterior knee pain on the left with no pain on the right. She has been taking Tylenol with no relief. She did try occasional naproxen. She has no popping or giving out and no locking in the knee. The patient is not a diabetic.   PAIN:  Are you having pain? Yes: NPRS scale: 0/10 Pain location: L knee Pain description: aching/ persistent Aggravating factors: walking/ movement Relieving factors: rest  PRECAUTIONS: None  WEIGHT BEARING RESTRICTIONS No  FALLS:  Has patient fallen in last 6 months? No  LIVING ENVIRONMENT: Lives with: lives with their spouse Lives in: House/apartment Stairs: Pt. Has pain with descending stairs.     OCCUPATION: retired  PLOF: Independent  PATIENT GOALS:  Decrease L knee pain/ return to gym   OBJECTIVE:   DIAGNOSTIC FINDINGS: Negative x-ray  PATIENT SURVEYS:  FOTO initial 59/ goal 47  COGNITION:  Overall cognitive status: Within functional limits for tasks assessed     SENSATION: WFL  EDEMA:  No swelling noted in L knee as compared to R.  Good joint line/ spacing  MUSCLE LENGTH: Hamstrings: Right WNL; Left WNL   POSTURE: Slight rounded shoulders and forward head  PALPATION: (+) L knee tenderness over infrapatellar tendon and posterior aspect of knee (?Bakers cyst).  LOWER EXTREMITY ROM:  B hip/knee AROM WNL.  L knee AROM in supine: 0-145 deg.    LOWER EXTREMITY MMT:  B LE muscle strength grossly 5/5 MMT except B hip flexion 4/5 MMT, B hip abduction 4+/5 MMT and L knee extension 4+/5 MMT (increase in pain).    LOWER EXTREMITY SPECIAL TESTS:  Knee special tests: Anterior drawer test: negative, Posterior drawer test: negative, McMurray's test: negative, and Patellafemoral apprehension test: positive    GAIT: Distance walked: in clinic Assistive device utilized: None Level of assistance: Complete Independence Comments: normalized gait pattern.  Increase in L knee  pain during eccentric quad control while descending stairs.      TODAY'S TREATMENT:  10/23/21  Subjective:  Pt. Entered PT with normalized gait and reports R knee symptoms (medial aspect of patella).  Pt. Reports compliance with HEP.  Pt. Brought in knee brace which initially felt good but support uprights rub skin.   Pt. Reports 2/10 L knee pain and 1/10 R knee pain prior to tx. Session.    There.ex.:  Scifit L6 10 min.B UE/LE (consistent cadence)- discussed HEP/ knee brace/ activity.  2nd step L/R knee lunges with static holds 5x each.  Standing hamstring stretches L/R with static holds.    TG: bilateral knee flexion (midline/ toe out)- 20x.  Pain limited with toe in.  Heel raises 20x each.    Seated L knee isometric/ tendon loading ex. With manual feedback 5x with holds.   Manual tx.:  Supine L knee/ rectus stretches 3x with 20 second holds.  Supine hamstring/ hip generalized stretches 8 minutes.    Supine L LE LAD 3x 20 second holds.    Discuss HEP/ [employer] knee brace.     PATIENT EDUCATION:  Education details: SLR/ hip abduction/ quad sets Person educated: Patient and Spouse Education method: Explanation, Media planner, and Handouts Education comprehension: verbalized understanding and returned demonstration   HOME EXERCISE PROGRAM: SLR/ hip abduction in sidelying/ tendon loading program.   ASSESSMENT:  CLINICAL IMPRESSION: Patient presents with good L knee AROM and no pain with knee flexion OP in supine position.  Good technique/ understanding of tendon loading and pt. Will participate at home. Normalized gait pattern but pain limited with descending stairs.  Pt. Will benefit from modification to gym based/HEP and skilled PT to improve functional mobility/ pain-free walking.     OBJECTIVE IMPAIRMENTS decreased activity tolerance, decreased endurance, decreased mobility, difficulty walking, decreased ROM, impaired flexibility, and pain.   ACTIVITY LIMITATIONS  lifting, bending, squatting, and locomotion level  PARTICIPATION LIMITATIONS: community activity and yard work  PERSONAL FACTORS Fitness and Past/current experiences are also affecting patient's functional outcome.   REHAB POTENTIAL: Excellent  CLINICAL DECISION MAKING: Stable/uncomplicated  EVALUATION COMPLEXITY: Low   GOALS: Goals reviewed with patient? Yes  SHORT TERM GOALS: Target date: 10/30/21 Pt. Will be independent with HEP to increase B LE muscle strength/ return to gym based ex.  Baseline:  currently not participating with gym Goal status: INITIAL   LONG TERM GOALS: Target date: 11/13/21  Pt. Will increase FOTO to 72 to improve pain-free mobility. Baseline: initial 59 Goal status: INITIAL  2.  Pt. Will increase B hip/ L quad strength 1/2 muscle grade to improve pain-free mobility/ return to gym.   Baseline:  See above Goal status: INITIAL  3.  Pt. Able to descend stairs with no c/o L knee pain to improve functional mobility.  Baseline: L knee pain during eccentric muscle control on L knee during R LE step  downs.  Goal status: INITIAL    PLAN: PT FREQUENCY: 2x/week  PT DURATION: 4 weeks  PLANNED INTERVENTIONS: Therapeutic exercises, Therapeutic activity, Neuromuscular re-education, Balance training, Gait training, Patient/Family education, Self Care, Joint mobilization, Taping, Manual therapy, and Re-evaluation  PLAN FOR NEXT SESSION: Reassess pain/ tenderness at L knee.  Review HEP  Pura Spice, PT, DPT # [id number] 10/26/2021, 1:16 PM

## 2021-10-26 NOTE — Therapy (Signed)
OUTPATIENT PHYSICAL THERAPY LOWER EXTREMITY TREATMENT   Patient Name: Jillian [name] MRN: [id number] DOB:11/26/1945, 76 y.o., female Today's Date: 10/25/2021   PT End of Session - 10/26/21 1619     Visit Number 4    Number of Visits 9    Date for PT Re-Evaluation 11/13/21    Authorization - Visit Number 4    Authorization - Number of Visits 10    PT Start Time [id number]    PT Stop Time 1030    PT Time Calculation (min) 51 min    Activity Tolerance Patient tolerated treatment well    Behavior During Therapy [facility] for tasks assessed/performed             No past medical history on file. 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 Reports increase in L knee pain around suprapatellar/infrapatellar tendon.  Pt. Reports she may have aggravated L knee during leg press at gym.  Pt. Enjoys walking 1 hour on TM at home and participating with ex. Program.  Pt. Reports 0/10 pain at rest and 5/10 at worst with movement.    PERTINENT HISTORY: See MD notes.  Pt. Known well to PT clinic.   Viviano Simas [place], [place] - 09/30/2021 2:00 PM EDT Formatting of this note is different from the original. Images from the original note were not included. Chief Complaint Chief Complaint  Patient presents with  Left Knee - Pain   Reason for Visit The patient is a 76 year old female that presented for complaints of her left knee. For several weeks now she has been having some increased pain in the front surface of the knee and her quadriceps. She had been doing some strengthening exercises with a leg press at  the gym. She also walks on the treadmill on an incline. She has anterior knee pain on the left with no pain on the right. She has been taking Tylenol with no relief. She did try occasional naproxen. She has no popping or giving out and no locking in the knee. The patient is not a diabetic.   PAIN:  Are you having pain? Yes: NPRS scale: 0/10 Pain location: L knee Pain description: aching/ persistent Aggravating factors: walking/ movement Relieving factors: rest  PRECAUTIONS: None  WEIGHT BEARING RESTRICTIONS No  FALLS:  Has patient fallen in last 6 months? No  LIVING ENVIRONMENT: Lives with: lives with their spouse Lives in: House/apartment Stairs: Pt. Has pain with descending stairs.     OCCUPATION: retired  PLOF: Independent  PATIENT GOALS:  Decrease L knee pain/ return to gym   OBJECTIVE:   DIAGNOSTIC FINDINGS: Negative x-ray  PATIENT SURVEYS:  FOTO initial 59/ goal 4  COGNITION:  Overall cognitive status: Within functional limits for tasks assessed     SENSATION: WFL  EDEMA:  No swelling noted in L knee as compared to R.  Good joint line/ spacing  MUSCLE LENGTH: Hamstrings: Right WNL; Left WNL   POSTURE: Slight rounded shoulders and forward head  PALPATION: (+) L knee tenderness over infrapatellar tendon and posterior aspect of knee (?Bakers cyst).  LOWER EXTREMITY ROM:  B hip/knee AROM WNL.  L knee AROM in supine: 0-145 deg.    LOWER EXTREMITY MMT:  B LE muscle strength grossly 5/5 MMT except B hip flexion 4/5 MMT, B hip abduction 4+/5 MMT and L knee extension 4+/5 MMT (increase in pain).    LOWER EXTREMITY SPECIAL TESTS:  Knee special tests: Anterior drawer test: negative, Posterior drawer test: negative, McMurray's test: negative, and Patellafemoral apprehension test: positive    GAIT: Distance walked: in clinic Assistive device utilized: None Level of assistance: Complete Independence Comments: normalized gait pattern.  Increase in L knee  pain during eccentric quad control while descending stairs.      TODAY'S TREATMENT:  10/25/21  Subjective:  Pt. Brought in another knee brace (copperfit) but no benefit or stability.    Pt. Reports L knee pain is "not too bad" prior to tx. Session.  Pt. Still has R medial knee discomfort but not as bad as L knee.    There.ex.:  Scifit L6 10 min.at seat #8 B UE/LE (consistent cadence)- discussed HEP/ knee brace/ activity.  Wall sits with yellow ball 10x (<20 sec. At max)- B LE muscle fasciculations  Walking in clinic with normalized gait pattern between exercises.    Ascending stairs with no knee pain.  Descending with slight increase in L knee pain with eccentric muscle control.    Seated L knee isometric/ tendon loading ex. With manual feedback 5x with holds.   Manual tx.:  Supine patellar mobs (all planes).  Supine L knee/ rectus stretches 3x with 20 second holds.  Supine hamstring/ hip generalized stretches 8 minutes.    Supine L LE LAD 3x 20 second holds.      PATIENT EDUCATION:  Education details: SLR/ hip abduction/ quad sets Person educated: Patient and Spouse Education method: Explanation, Facilities manager, and Handouts Education comprehension: verbalized understanding and returned demonstration   HOME EXERCISE PROGRAM: SLR/ hip abduction in sidelying/ tendon loading program.   ASSESSMENT:  CLINICAL IMPRESSION: Patient presents with good L knee AROM and no pain with knee flexion OP in supine position.  Pt. Has not benefited from knee braces and will continue to find a more supportive brace that fits better.  Good technique/ understanding of tendon loading and pt. Will participate at home. Normalized gait pattern but pain limited with descending stairs. Pt. Instructed to avoid any knee pain while walking on TM at home.  Pt. Will benefit from modification to gym based/HEP and skilled PT to improve functional mobility/ pain-free walking.     OBJECTIVE IMPAIRMENTS  decreased activity tolerance, decreased endurance, decreased mobility, difficulty walking, decreased ROM, impaired flexibility, and pain.   ACTIVITY LIMITATIONS lifting, bending, squatting, and locomotion level  PARTICIPATION LIMITATIONS: community activity and yard work  PERSONAL FACTORS Fitness and Past/current experiences are also affecting patient's functional outcome.   REHAB POTENTIAL: Excellent  CLINICAL DECISION MAKING: Stable/uncomplicated  EVALUATION COMPLEXITY: Low   GOALS: Goals reviewed with patient? Yes  SHORT TERM GOALS: Target date: 10/30/21 Pt. Will be independent with HEP to increase B LE muscle strength/ return to gym based ex.  Baseline:  currently not participating with gym Goal status: INITIAL   LONG TERM GOALS: Target date: 11/13/21  Pt. Will increase FOTO to 72 to improve pain-free mobility. Baseline: initial 59 Goal status: INITIAL  2.  Pt. Will increase B hip/ L quad strength 1/2 muscle grade to improve pain-free mobility/ return to gym.   Baseline:  See above Goal status: INITIAL  3.  Pt. Able to descend stairs with no c/o L knee pain to improve functional mobility.  Baseline: L knee pain during eccentric muscle control on L knee during R LE step downs.  Goal status: INITIAL    PLAN: PT FREQUENCY: 2x/week  PT DURATION: 4 weeks  PLANNED INTERVENTIONS: Therapeutic exercises, Therapeutic activity, Neuromuscular re-education, Balance training, Gait training, Patient/Family education, Self Care, Joint mobilization, Taping, Manual therapy, and Re-evaluation  PLAN FOR NEXT SESSION: Progress HEP.  Check STG  Pura Spice, PT, DPT # [phone] 10/26/2021, 4:24 PM

## 2021-10-26 NOTE — Therapy (Signed)
OUTPATIENT PHYSICAL THERAPY LOWER EXTREMITY EVALUATION   Patient Name: Jillian [name] MRN: [id number] DOB:28-Jan-1946, 76 y.o., female Today's Date: 10/18/2021   PT End of Session - 10/26/21 1057     Visit Number 2    Number of Visits 9    Date for PT Re-Evaluation 11/13/21    Authorization - Visit Number 2    Authorization - Number of Visits 10    PT Start Time 1429    PT Stop Time 1516    PT Time Calculation (min) 47 min    Activity Tolerance Patient tolerated treatment well    Behavior During Therapy [facility] for tasks assessed/performed             No past medical history on file. 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 Reports increase in L knee pain around suprapatellar/infrapatellar tendon.  Pt. Reports she may have aggravated L knee during leg press at gym.  Pt. Enjoys walking 1 hour on TM at home and participating with ex. Program.  Pt. Reports 0/10 pain at rest and 5/10 at worst with movement.    PERTINENT HISTORY: See MD notes.  Pt. Known well to PT clinic.   [name], [place] - 09/30/2021 2:00 PM EDT Formatting of this note is different from the original. Images from the original note were not included. Chief Complaint Chief Complaint  Patient presents with  Left Knee - Pain   Reason for Visit The patient is a 76 year old female that presented for complaints of her left knee. For several weeks now she has been having some increased pain in the front surface of the knee and her quadriceps. She had been doing some strengthening exercises with a leg press at  the gym. She also walks on the treadmill on an incline. She has anterior knee pain on the left with no pain on the right. She has been taking Tylenol with no relief. She did try occasional naproxen. She has no popping or giving out and no locking in the knee. The patient is not a diabetic.   PAIN:  Are you having pain? Yes: NPRS scale: 0/10 Pain location: L knee Pain description: aching/ persistent Aggravating factors: walking/ movement Relieving factors: rest  PRECAUTIONS: None  WEIGHT BEARING RESTRICTIONS No  FALLS:  Has patient fallen in last 6 months? No  LIVING ENVIRONMENT: Lives with: lives with their spouse Lives in: House/apartment Stairs: Pt. Has pain with descending stairs.     OCCUPATION: retired  PLOF: Independent  PATIENT GOALS:  Decrease L knee pain/ return to gym   OBJECTIVE:   DIAGNOSTIC FINDINGS: Negative x-ray  PATIENT SURVEYS:  FOTO initial 59/ goal 31  COGNITION:  Overall cognitive status: Within functional limits for tasks assessed     SENSATION: WFL  EDEMA:  No swelling noted in L knee as compared to R.  Good joint line/ spacing  MUSCLE LENGTH: Hamstrings: Right WNL; Left WNL   POSTURE: Slight rounded shoulders and forward head  PALPATION: (+) L knee tenderness over infrapatellar tendon and posterior aspect of knee (?Bakers cyst).  LOWER EXTREMITY ROM:  B hip/knee AROM WNL.  L knee AROM in supine: 0-145 deg.    LOWER EXTREMITY MMT:  B LE muscle strength grossly 5/5 MMT except B hip flexion 4/5 MMT, B hip abduction 4+/5 MMT and L knee extension 4+/5 MMT (increase in pain).    LOWER EXTREMITY SPECIAL TESTS:  Knee special tests: Anterior drawer test: negative, Posterior drawer test: negative, McMurray's test: negative, and Patellafemoral apprehension test: positive    GAIT: Distance walked: in clinic Assistive device utilized: None Level of assistance: Complete Independence Comments: normalized gait pattern.  Increase in L knee  pain during eccentric quad control while descending stairs.      TODAY'S TREATMENT:  10/18/21  Subjective:  Pt. Reports no new complaints.  Pt. Understands the importance of icing.    There.ex.:  Scifit L6 10 min.B UE/LE (consistent cadence)- slight increase in L knee pain with increase flexion/ moved bike seat back.    TG: bilateral knee flexion (midline/ toe out)- 20x.  Pain limited with toe in.  Heel raises 20x each.    Seated L knee isometric/ tendon loading ex. With mob. Belt 5x 5-10 sec. Holds.    Manual tx.:  Supine L knee/ rectus stretches 3x with 20 second holds.  Supine hamstring/ hip generalized stretches 11 minutes.    Supine L LE LAD 3x 20 second holds.    Will email HEP/ PFPS education   PATIENT EDUCATION:  Education details: SLR/ hip abduction/ quad sets Person educated: Patient and Spouse Education method: Explanation, Demonstration, and Handouts Education comprehension: verbalized understanding and returned demonstration   HOME EXERCISE PROGRAM: SLR/ hip abduction in sidelying/ tendon loading program.   ASSESSMENT:  CLINICAL IMPRESSION: Patient presents with good L knee AROM and no pain with knee flexion OP in supine position.  Good technique/ understanding of tendon loading and pt. Will participate at home. Normalized gait pattern but pain limited with descending stairs.  Pt. Will benefit from modification to gym based/HEP and skilled PT to improve functional mobility/ pain-free walking.     OBJECTIVE IMPAIRMENTS decreased activity tolerance, decreased endurance, decreased mobility, difficulty walking, decreased ROM, impaired flexibility, and pain.   ACTIVITY LIMITATIONS lifting, bending, squatting, and locomotion level  PARTICIPATION LIMITATIONS: community activity and yard work  PERSONAL FACTORS Fitness and Past/current experiences are also affecting patient's functional outcome.   REHAB POTENTIAL: Excellent  CLINICAL DECISION MAKING:  Stable/uncomplicated  EVALUATION COMPLEXITY: Low   GOALS: Goals reviewed with patient? Yes  SHORT TERM GOALS: Target date: 10/30/21 Pt. Will be independent with HEP to increase B LE muscle strength/ return to gym based ex.  Baseline:  currently not participating with gym Goal status: INITIAL   LONG TERM GOALS: Target date: 11/13/21  Pt. Will increase FOTO to 72 to improve pain-free mobility. Baseline: initial 59 Goal status: INITIAL  2.  Pt. Will increase B hip/ L quad strength 1/2 muscle grade to improve pain-free mobility/ return to gym.   Baseline:  See above Goal status: INITIAL  3.  Pt. Able to descend stairs with no c/o L knee pain to improve functional mobility.  Baseline: L knee pain during eccentric muscle control on L knee during R LE step downs.  Goal status: INITIAL    PLAN: PT FREQUENCY: 2x/week  PT DURATION: 4 weeks  PLANNED INTERVENTIONS: Therapeutic exercises, Therapeutic activity, Neuromuscular re-education, Balance training, Gait training, Patient/Family education, Self Care, Joint mobilization, Taping, Manual therapy, and Re-evaluation  PLAN FOR NEXT SESSION: Reassess pain/ tenderness at L knee.  Review HEP  Pura Spice, PT, DPT # [phone] 10/26/2021, 11:02 AM

## 2021-10-29 ENCOUNTER — Encounter: Payer: Self-pay | Admitting: Physical Therapy

## 2021-10-29 ENCOUNTER — Ambulatory Visit: Payer: Medicare Other | Admitting: Physical Therapy

## 2021-10-29 DIAGNOSIS — M25562 Pain in left knee: Secondary | ICD-10-CM

## 2021-10-29 DIAGNOSIS — M6281 Muscle weakness (generalized): Secondary | ICD-10-CM

## 2021-10-29 NOTE — Therapy (Signed)
OUTPATIENT PHYSICAL THERAPY LOWER EXTREMITY TREATMENT   Patient Name: Jillian [name] MRN: [id number] DOB:12-24-1945, 76 y.o., female Today's Date: 10/29/2021   PT End of Session - 10/29/21 1255     Visit Number 5    Number of Visits 9    Date for PT Re-Evaluation 11/13/21    Authorization - Visit Number 5    Authorization - Number of Visits 10    PT Start Time 0932    PT Stop Time 1346    PT Time Calculation (min) 51 min    Activity Tolerance Patient tolerated treatment well    Behavior During Therapy [facility] for tasks assessed/performed             History reviewed. No pertinent past medical history. Past Surgical History:  Procedure Laterality Date   BREAST BIOPSY Right 1981   neg   BREAST EXCISIONAL BIOPSY       PCP: Adin Hector, MD  REFERRING PROVIDER: Reche Dixon, PA-C  REFERRING DIAG: Patellofemoral syndrome of left knee  THERAPY DIAG:  Acute left knee pain Muscle weakness  Rationale for Evaluation and Treatment Rehabilitation  ONSET DATE: Few weeks ago (09/17/21)  SUBJECTIVE:   SUBJECTIVE STATEMENT: Pt. Reports increase in L knee pain around suprapatellar/infrapatellar tendon.  Pt. Reports she may have aggravated L knee during leg press at gym.  Pt. Enjoys walking 1 hour on TM at home and participating with ex. Program.  Pt. Reports 0/10 pain at rest and 5/10 at worst with movement.    PERTINENT HISTORY: See MD notes.  Pt. Known well to PT clinic.   Hampton Abbot [place], [place] - 09/30/2021 2:00 PM EDT Formatting of this note is different from the original. Images from the original note were not included. Chief Complaint Chief Complaint  Patient presents with  Left Knee - Pain   Reason for Visit The patient is a 76 year old female that presented for complaints of her left knee. For several weeks now she has been having some increased pain in the front surface of the knee and her quadriceps. She had been doing some strengthening exercises  with a leg press at the gym. She also walks on the treadmill on an incline. She has anterior knee pain on the left with no pain on the right. She has been taking Tylenol with no relief. She did try occasional naproxen. She has no popping or giving out and no locking in the knee. The patient is not a diabetic.   PAIN:  Are you having pain? Yes: NPRS scale: 0/10 Pain location: L knee Pain description: aching/ persistent Aggravating factors: walking/ movement Relieving factors: rest  PRECAUTIONS: None  WEIGHT BEARING RESTRICTIONS No  FALLS:  Has patient fallen in last 6 months? No  LIVING ENVIRONMENT: Lives with: lives with their spouse Lives in: House/apartment Stairs: Pt. Has pain with descending stairs.     OCCUPATION: retired  PLOF: Independent  PATIENT GOALS:  Decrease L knee pain/ return to gym   OBJECTIVE:   DIAGNOSTIC FINDINGS: Negative x-ray  PATIENT SURVEYS:  FOTO initial 59/ goal 76  COGNITION:  Overall cognitive status: Within functional limits for tasks assessed     SENSATION: WFL  EDEMA:  No swelling noted in L knee as compared to R.  Good joint line/ spacing  MUSCLE LENGTH: Hamstrings: Right WNL; Left WNL   POSTURE: Slight rounded shoulders and forward head  PALPATION: (+) L knee tenderness over infrapatellar tendon and posterior aspect of knee (?Bakers cyst).  LOWER EXTREMITY ROM:  B hip/knee AROM WNL.  L knee AROM in supine: 0-145 deg.    LOWER EXTREMITY MMT:  B LE muscle strength grossly 5/5 MMT except B hip flexion 4/5 MMT, B hip abduction 4+/5 MMT and L knee extension 4+/5 MMT (increase in pain).    LOWER EXTREMITY SPECIAL TESTS:  Knee special tests: Anterior drawer test: negative, Posterior drawer test: negative, McMurray's test: negative, and Patellafemoral apprehension test: positive    GAIT: Distance walked: in clinic Assistive device utilized: None Level of assistance: Complete Independence Comments: normalized gait pattern.   Increase in L knee pain during eccentric quad control while descending stairs.      TODAY'S TREATMENT:  10/29/21  Subjective:  Pt. Reports L knee pain was good over past 2 days but has discomfort today.  Pt. Reports 3/10 L knee pain and 1/10 R knee pain.    There.ex.:  TM 2.0 mph with no incline for 5 min. (Gait reassessment).    Scifit L8 10 min.at seat #8 B UE/LE (consistent cadence).  Wall sits with yellow ball 10x (<20 sec. At max)- B LE muscle fasciculations  Walking in clinic with normalized gait pattern between exercises.    Ascending stairs with no knee pain.  Descending with slight increase in L knee pain with eccentric muscle control.    Seated L knee isometric/ tendon loading ex. With manual feedback 5x with holds.   Manual tx.:  Supine patellar mobs (all planes).  Supine L knee/ rectus stretches 3x with 20 second holds.  Supine hamstring/ hip generalized stretches 8 minutes.    Supine L LE LAD 3x 20 second holds.      PATIENT EDUCATION:  Education details: SLR/ hip abduction/ quad sets Person educated: Patient and Spouse Education method: Explanation, Demonstration, and Handouts Education comprehension: verbalized understanding and returned demonstration   HOME EXERCISE PROGRAM: SLR/ hip abduction in sidelying/ tendon loading program.  Access Code: [email or web address] URL: [email or web address] Date: 10/29/2021 Prepared by: Dorcas Carrow  Exercises - Active Straight Leg Raise with Quad Set  - 1 x daily - 5 x weekly - 2 sets - 10 reps - Supine Bridge  - 1 x daily - 5 x weekly - 2 sets - 10 reps - Sidelying Hip Abduction  - 1 x daily - 5 x weekly - 2 sets - 10 reps - Wall Sit  - 1 x daily - 5 x weekly - 1 sets - 10 reps   ASSESSMENT:  CLINICAL IMPRESSION: Patient presents with good L knee AROM and no pain with knee flexion OP in supine position.  Good technique/ understanding of tendon loading and pt. Will participate at home. Normalized gait  pattern but pain limited with descending stairs. Pt. Instructed to avoid any knee pain while walking on TM at home.  Pt. Will benefit from modification to gym based/HEP and skilled PT to improve functional mobility/ pain-free walking.     OBJECTIVE IMPAIRMENTS decreased activity tolerance, decreased endurance, decreased mobility, difficulty walking, decreased ROM, impaired flexibility, and pain.   ACTIVITY LIMITATIONS lifting, bending, squatting, and locomotion level  PARTICIPATION LIMITATIONS: community activity and yard work  PERSONAL FACTORS Fitness and Past/current experiences are also affecting patient's functional outcome.   REHAB POTENTIAL: Excellent  CLINICAL DECISION MAKING: Stable/uncomplicated  EVALUATION COMPLEXITY: Low   GOALS: Goals reviewed with patient? Yes  SHORT TERM GOALS: Target date: 10/30/21 Pt. Will be independent with HEP to increase B LE muscle strength/ return to gym based ex.  Baseline:  currently  not participating with gym Goal status: Goal met   LONG TERM GOALS: Target date: 11/13/21  Pt. Will increase FOTO to 72 to improve pain-free mobility. Baseline: initial 59 Goal status: INITIAL  2.  Pt. Will increase B hip/ L quad strength 1/2 muscle grade to improve pain-free mobility/ return to gym.   Baseline:  See above Goal status: INITIAL  3.  Pt. Able to descend stairs with no c/o L knee pain to improve functional mobility.  Baseline: L knee pain during eccentric muscle control on L knee during R LE step downs.  Goal status: INITIAL    PLAN: PT FREQUENCY: 2x/week  PT DURATION: 4 weeks  PLANNED INTERVENTIONS: Therapeutic exercises, Therapeutic activity, Neuromuscular re-education, Balance training, Gait training, Patient/Family education, Self Care, Joint mobilization, Taping, Manual therapy, and Re-evaluation  PLAN FOR NEXT SESSION:  Discuss HEP  Pura Spice, PT, DPT # [phone] 10/29/2021, 8:01 PM

## 2021-11-01 ENCOUNTER — Encounter: Payer: Self-pay | Admitting: Physical Therapy

## 2021-11-01 ENCOUNTER — Ambulatory Visit: Payer: Medicare Other | Admitting: Physical Therapy

## 2021-11-01 DIAGNOSIS — M25562 Pain in left knee: Secondary | ICD-10-CM | POA: Diagnosis not present

## 2021-11-01 DIAGNOSIS — M6281 Muscle weakness (generalized): Secondary | ICD-10-CM

## 2021-11-01 NOTE — Therapy (Signed)
OUTPATIENT PHYSICAL THERAPY LOWER EXTREMITY TREATMENT   Patient Name: Jillian [name] MRN: [id number] DOB:1945-06-19, 76 y.o., female Today's Date: 11/01/2021   PT End of Session - 11/01/21 1253     Visit Number 6    Number of Visits 9    Date for PT Re-Evaluation 11/13/21    Authorization - Visit Number 6    Authorization - Number of Visits 10    PT Start Time 4401    PT Stop Time 1343    PT Time Calculation (min) 50 min    Activity Tolerance Patient tolerated treatment well    Behavior During Therapy [facility] for tasks assessed/performed             History reviewed. No pertinent past medical history. Past Surgical History:  Procedure Laterality Date   BREAST BIOPSY Right 1981   neg   BREAST EXCISIONAL BIOPSY       PCP: Adin Hector, MD  REFERRING PROVIDER: Reche Dixon, PA-C  REFERRING DIAG: Patellofemoral syndrome of left knee  THERAPY DIAG:  Acute left knee pain Muscle weakness  Rationale for Evaluation and Treatment Rehabilitation  ONSET DATE: Few weeks ago (09/17/21)  SUBJECTIVE:   SUBJECTIVE STATEMENT: Pt. Reports increase in L knee pain around suprapatellar/infrapatellar tendon.  Pt. Reports she may have aggravated L knee during leg press at gym.  Pt. Enjoys walking 1 hour on TM at home and participating with ex. Program.  Pt. Reports 0/10 pain at rest and 5/10 at worst with movement.    PERTINENT HISTORY: See MD notes.  Pt. Known well to PT clinic.   Hampton Abbot [place], [place] - 09/30/2021 2:00 PM EDT Formatting of this note is different from the original. Images from the original note were not included. Chief Complaint Chief Complaint  Patient presents with  Left Knee - Pain   Reason for Visit The patient is a 76 year old female that presented for complaints of her left knee. For several weeks now she has been having some increased pain in the front surface of the knee and her quadriceps. She had been doing some strengthening exercises  with a leg press at the gym. She also walks on the treadmill on an incline. She has anterior knee pain on the left with no pain on the right. She has been taking Tylenol with no relief. She did try occasional naproxen. She has no popping or giving out and no locking in the knee. The patient is not a diabetic.   PAIN:  Are you having pain? Yes: NPRS scale: 0/10 Pain location: L knee Pain description: aching/ persistent Aggravating factors: walking/ movement Relieving factors: rest  PRECAUTIONS: None  WEIGHT BEARING RESTRICTIONS No  FALLS:  Has patient fallen in last 6 months? No  LIVING ENVIRONMENT: Lives with: lives with their spouse Lives in: House/apartment Stairs: Pt. Has pain with descending stairs.     OCCUPATION: retired  PLOF: Independent  PATIENT GOALS:  Decrease L knee pain/ return to gym   OBJECTIVE:   DIAGNOSTIC FINDINGS: Negative x-ray  PATIENT SURVEYS:  FOTO initial 59/ goal 40  COGNITION:  Overall cognitive status: Within functional limits for tasks assessed     SENSATION: WFL  EDEMA:  No swelling noted in L knee as compared to R.  Good joint line/ spacing  MUSCLE LENGTH: Hamstrings: Right WNL; Left WNL   POSTURE: Slight rounded shoulders and forward head  PALPATION: (+) L knee tenderness over infrapatellar tendon and posterior aspect of knee (?Bakers cyst).  LOWER EXTREMITY ROM:  B hip/knee AROM WNL.  L knee AROM in supine: 0-145 deg.    LOWER EXTREMITY MMT:  B LE muscle strength grossly 5/5 MMT except B hip flexion 4/5 MMT, B hip abduction 4+/5 MMT and L knee extension 4+/5 MMT (increase in pain).    LOWER EXTREMITY SPECIAL TESTS:  Knee special tests: Anterior drawer test: negative, Posterior drawer test: negative, McMurray's test: negative, and Patellafemoral apprehension test: positive    GAIT: Distance walked: in clinic Assistive device utilized: None Level of assistance: Complete Independence Comments: normalized gait pattern.   Increase in L knee pain during eccentric quad control while descending stairs.      TODAY'S TREATMENT:  11/01/21  Subjective:  Pt. Reports 2/10 L knee pain prior to PT tx. Session.  Pt. States she has been dealing with dizziness/ HA (reports 4/10).  Pt. Reports 3/10 L knee pain and 1/10 R knee pain.    There.ex.:     Walking in clinic with normalized gait pattern between exercises.       BOSU step ups on L LE 5x (light UE in //-bars).  Reverse BOSU wt. Shifting/ partial squats (increase L knee discomfort reported with modified squats).    Scifit L8 10 min.at seat #8 B UE/LE (consistent cadence).  Nautilus: resisted gait 50# 6x all 4-planes at agility ladder.   Seated L knee isometric/ tendon loading ex. With manual feedback 5x with holds.   Manual tx.:  Supine patellar mobs (all planes).  Supine L knee/ rectus stretches 3x with 20 second holds.  Supine hamstring/ hip generalized stretches 8 minutes.    Supine L LE LAD 3x 20 second holds.      PATIENT EDUCATION:  Education details: SLR/ hip abduction/ quad sets Person educated: Patient and Spouse Education method: Explanation, Demonstration, and Handouts Education comprehension: verbalized understanding and returned demonstration   HOME EXERCISE PROGRAM: SLR/ hip abduction in sidelying/ tendon loading program.  Access Code: [email or web address] URL: [email or web address] Date: 10/29/2021 Prepared by: Dorcas Carrow  Exercises - Active Straight Leg Raise with Quad Set  - 1 x daily - 5 x weekly - 2 sets - 10 reps - Supine Bridge  - 1 x daily - 5 x weekly - 2 sets - 10 reps - Sidelying Hip Abduction  - 1 x daily - 5 x weekly - 2 sets - 10 reps - Wall Sit  - 1 x daily - 5 x weekly - 1 sets - 10 reps   ASSESSMENT:  CLINICAL IMPRESSION: Patient presents with good L knee AROM and no pain with knee flexion OP in supine position.  Good technique/ understanding of tendon loading and pt. Will participate at home. Normalized  gait pattern but pain limited with descending stairs. Pt. Instructed to avoid any knee pain while walking on TM at home.  Pt. Will benefit from modification to gym based/HEP and skilled PT to improve functional mobility/ pain-free walking.     OBJECTIVE IMPAIRMENTS decreased activity tolerance, decreased endurance, decreased mobility, difficulty walking, decreased ROM, impaired flexibility, and pain.   ACTIVITY LIMITATIONS lifting, bending, squatting, and locomotion level  PARTICIPATION LIMITATIONS: community activity and yard work  PERSONAL FACTORS Fitness and Past/current experiences are also affecting patient's functional outcome.   REHAB POTENTIAL: Excellent  CLINICAL DECISION MAKING: Stable/uncomplicated  EVALUATION COMPLEXITY: Low   GOALS: Goals reviewed with patient? Yes  SHORT TERM GOALS: Target date: 10/30/21 Pt. Will be independent with HEP to increase B LE muscle strength/ return to gym based ex.  Baseline:  currently not participating with gym Goal status: Goal met   LONG TERM GOALS: Target date: 11/13/21  Pt. Will increase FOTO to 72 to improve pain-free mobility. Baseline: initial 59 Goal status: INITIAL  2.  Pt. Will increase B hip/ L quad strength 1/2 muscle grade to improve pain-free mobility/ return to gym.   Baseline:  See above Goal status: INITIAL  3.  Pt. Able to descend stairs with no c/o L knee pain to improve functional mobility.  Baseline: L knee pain during eccentric muscle control on L knee during R LE step downs.  Goal status: INITIAL    PLAN: PT FREQUENCY: 2x/week  PT DURATION: 4 weeks  PLANNED INTERVENTIONS: Therapeutic exercises, Therapeutic activity, Neuromuscular re-education, Balance training, Gait training, Patient/Family education, Self Care, Joint mobilization, Taping, Manual therapy, and Re-evaluation  PLAN FOR NEXT SESSION:  Discuss HEP  Pura Spice, PT, DPT # [phone] 11/01/2021, 1:44 PM

## 2021-11-04 ENCOUNTER — Ambulatory Visit: Payer: Medicare Other | Attending: Orthopedic Surgery | Admitting: Physical Therapy

## 2021-11-04 DIAGNOSIS — M25562 Pain in left knee: Secondary | ICD-10-CM | POA: Diagnosis present

## 2021-11-04 DIAGNOSIS — M6281 Muscle weakness (generalized): Secondary | ICD-10-CM | POA: Diagnosis present

## 2021-11-04 NOTE — Therapy (Signed)
OUTPATIENT PHYSICAL THERAPY LOWER EXTREMITY TREATMENT   Patient Name: Jillian [name] MRN: [id number] DOB:1945/04/19, 76 y.o., female Today's Date: 11/04/2021   PT End of Session - 11/04/21 0928     Visit Number 7    Number of Visits 9    Date for PT Re-Evaluation 11/13/21    Authorization - Visit Number 7    Authorization - Number of Visits 10    PT Start Time [phone]    PT Stop Time 1031    PT Time Calculation (min) 55 min    Activity Tolerance Patient tolerated treatment well    Behavior During Therapy [facility] for tasks assessed/performed             No past medical history on file. Past Surgical History:  Procedure Laterality Date   BREAST BIOPSY Right 1981   neg   BREAST EXCISIONAL BIOPSY       PCP: Adin Hector, MD  REFERRING PROVIDER: Reche Dixon, PA-C  REFERRING DIAG: Patellofemoral syndrome of left knee  THERAPY DIAG:  Acute left knee pain Muscle weakness  Rationale for Evaluation and Treatment Rehabilitation  ONSET DATE: Few weeks ago (09/17/21)  SUBJECTIVE:   SUBJECTIVE STATEMENT:  EVALUATION Pt. Reports increase in L knee pain around suprapatellar/infrapatellar tendon.  Pt. Reports she may have aggravated L knee during leg press at gym.  Pt. Enjoys walking 1 hour on TM at home and participating with ex. Program.  Pt. Reports 0/10 pain at rest and 5/10 at worst with movement.    PERTINENT HISTORY: See MD notes.  Pt. Known well to PT clinic.   Hampton Abbot [place], [place] - 09/30/2021 2:00 PM EDT Formatting of this note is different from the original. Images from the original note were not included. Chief Complaint Chief Complaint  Patient presents with  Left Knee - Pain   Reason for Visit The patient is a 76 year old female that presented for complaints of her left knee. For several weeks now she has been having some increased pain in the front surface of the knee and her quadriceps. She had been doing some strengthening exercises with a  leg press at the gym. She also walks on the treadmill on an incline. She has anterior knee pain on the left with no pain on the right. She has been taking Tylenol with no relief. She did try occasional naproxen. She has no popping or giving out and no locking in the knee. The patient is not a diabetic.   PAIN:  Are you having pain? Yes: NPRS scale: 0/10 Pain location: L knee Pain description: aching/ persistent Aggravating factors: walking/ movement Relieving factors: rest  PRECAUTIONS: None  WEIGHT BEARING RESTRICTIONS No  FALLS:  Has patient fallen in last 6 months? No  LIVING ENVIRONMENT: Lives with: lives with their spouse Lives in: House/apartment Stairs: Pt. Has pain with descending stairs.     OCCUPATION: retired  PLOF: Independent  PATIENT GOALS:  Decrease L knee pain/ return to gym   OBJECTIVE:   DIAGNOSTIC FINDINGS: Negative x-ray  PATIENT SURVEYS:  FOTO initial 59/ goal 61  COGNITION:  Overall cognitive status: Within functional limits for tasks assessed   SENSATION: WFL  EDEMA:  No swelling noted in L knee as compared to R.  Good joint line/ spacing  MUSCLE LENGTH: Hamstrings: Right WNL; Left WNL  POSTURE: Slight rounded shoulders and forward head  PALPATION: (+) L knee tenderness over infrapatellar tendon and posterior aspect of knee (?Bakers cyst).    LOWER  EXTREMITY ROM:  B hip/knee AROM WNL.  L knee AROM in supine: 0-145 deg.    LOWER EXTREMITY MMT:  B LE muscle strength grossly 5/5 MMT except B hip flexion 4/5 MMT, B hip abduction 4+/5 MMT and L knee extension 4+/5 MMT (increase in pain).    LOWER EXTREMITY SPECIAL TESTS:  Knee special tests: Anterior drawer test: negative, Posterior drawer test: negative, McMurray's test: negative, and Patellafemoral apprehension test: positive    GAIT: Distance walked: in clinic Assistive device utilized: None Level of assistance: Complete Independence Comments: normalized gait pattern.  Increase  in L knee pain during eccentric quad control while descending stairs.      TODAY'S TREATMENT:  11/04/21  Subjective:  Pt. Reports less L knee pain over the weekend/ this morning.  Pt. Had a busy morning picking up trash on side of road.     There.ex.:     Scifit L8 10 min.at seat #8 B UE/LE (consistent cadence).     Walking in clinic/ hallway with normalized gait pattern between exercises.       BOSU step ups (forward/ lateral) on L LE 5x (light UE in //-bars).  Reverse BOSU wt. Shifting/ partial squats (increase L knee discomfort reported with modified squats).    Nautilus: resisted gait 50# 6x all 4-planes at agility ladder.   Hallway marching/ alt UE and LE touches/ braiding 2 laps (no UE assist).   Seated L knee 5# LAQ with focus on eccentric muscle control 10x each  Manual tx.:  Supine patellar mobs (all planes).  Grade II-III AP mobs. To proximal tibia 2x20 sec.   Supine L knee/ rectus stretches 3x with 20 second holds.  Supine hamstring/ hip generalized stretches 6 minutes.       PATIENT EDUCATION:  Education details: SLR/ hip abduction/ quad sets Person educated: Patient and Spouse Education method: Explanation, Demonstration, and Handouts Education comprehension: verbalized understanding and returned demonstration   HOME EXERCISE PROGRAM: SLR/ hip abduction in sidelying/ tendon loading program.  Access Code: [email or web address] URL: [email or web address] Date: 10/29/2021 Prepared by: Dorcas Carrow  Exercises - Active Straight Leg Raise with Quad Set  - 1 x daily - 5 x weekly - 2 sets - 10 reps - Supine Bridge  - 1 x daily - 5 x weekly - 2 sets - 10 reps - Sidelying Hip Abduction  - 1 x daily - 5 x weekly - 2 sets - 10 reps - Wall Sit  - 1 x daily - 5 x weekly - 1 sets - 10 reps   ASSESSMENT:  CLINICAL IMPRESSION: Patient presents with good L knee AROM and no pain with knee flexion OP in supine position.  Good technique/ understanding of tendon loading  and pt. Will participate at home. Normalized gait pattern in clinic and walking outside. Pt. Instructed to avoid any knee pain while walking on TM at home.  Pt. Will benefit from modification to gym based/HEP and skilled PT to improve functional mobility/ pain-free walking.     OBJECTIVE IMPAIRMENTS decreased activity tolerance, decreased endurance, decreased mobility, difficulty walking, decreased ROM, impaired flexibility, and pain.   ACTIVITY LIMITATIONS lifting, bending, squatting, and locomotion level  PARTICIPATION LIMITATIONS: community activity and yard work  PERSONAL FACTORS Fitness and Past/current experiences are also affecting patient's functional outcome.   REHAB POTENTIAL: Excellent  CLINICAL DECISION MAKING: Stable/uncomplicated  EVALUATION COMPLEXITY: Low   GOALS: Goals reviewed with patient? Yes  SHORT TERM GOALS: Target date: 10/30/21 Pt. Will be independent with HEP to increase  B LE muscle strength/ return to gym based ex.  Baseline:  currently not participating with gym Goal status: Goal met   LONG TERM GOALS: Target date: 11/13/21  Pt. Will increase FOTO to 72 to improve pain-free mobility. Baseline: initial 59 Goal status: INITIAL  2.  Pt. Will increase B hip/ L quad strength 1/2 muscle grade to improve pain-free mobility/ return to gym.   Baseline:  See above Goal status: INITIAL  3.  Pt. Able to descend stairs with no c/o L knee pain to improve functional mobility.  Baseline: L knee pain during eccentric muscle control on L knee during R LE step downs.  Goal status: INITIAL    PLAN: PT FREQUENCY: 2x/week  PT DURATION: 4 weeks  PLANNED INTERVENTIONS: Therapeutic exercises, Therapeutic activity, Neuromuscular re-education, Balance training, Gait training, Patient/Family education, Self Care, Joint mobilization, Taping, Manual therapy, and Re-evaluation  PLAN FOR NEXT SESSION:  Progress HEP.  Check on knee support/brace  Pura Spice, PT, DPT  # [phone] 11/04/2021, 11:56 AM

## 2021-11-06 ENCOUNTER — Ambulatory Visit: Payer: Medicare Other | Admitting: Physical Therapy

## 2021-11-11 ENCOUNTER — Encounter: Payer: Self-pay | Admitting: Physical Therapy

## 2021-11-11 ENCOUNTER — Ambulatory Visit: Payer: Medicare Other | Admitting: Physical Therapy

## 2021-11-11 DIAGNOSIS — M6281 Muscle weakness (generalized): Secondary | ICD-10-CM

## 2021-11-11 DIAGNOSIS — M25562 Pain in left knee: Secondary | ICD-10-CM

## 2021-11-11 NOTE — Therapy (Signed)
OUTPATIENT PHYSICAL THERAPY LOWER EXTREMITY TREATMENT   Patient Name: Jillian [name] MRN: [id number] DOB:[birth year], 76 y.o., female Today's Date: 11/04/2021   PT End of Session - 11/11/21 1224     Visit Number 7    Number of Visits 9    Date for PT Re-Evaluation 11/13/21    Authorization - Visit Number 7    Authorization - Number of Visits 10    PT Start Time [phone]    PT Stop Time 1000    PT Time Calculation (min) 17 min    Activity Tolerance Patient tolerated treatment well    Behavior During Therapy [facility] for tasks assessed/performed             History reviewed. No pertinent past medical history. Past Surgical History:  Procedure Laterality Date   BREAST BIOPSY Right 1981   neg   BREAST EXCISIONAL BIOPSY       PCP: Adin Hector, MD  REFERRING PROVIDER: Reche Dixon, PA-C  REFERRING DIAG: Patellofemoral syndrome of left knee  THERAPY DIAG:  Acute left knee pain Muscle weakness  Rationale for Evaluation and Treatment Rehabilitation  ONSET DATE: Few weeks ago (09/17/21)  SUBJECTIVE:   SUBJECTIVE STATEMENT:  EVALUATION Pt. Reports increase in L knee pain around suprapatellar/infrapatellar tendon.  Pt. Reports she may have aggravated L knee during leg press at gym.  Pt. Enjoys walking 1 hour on TM at home and participating with ex. Program.  Pt. Reports 0/10 pain at rest and 5/10 at worst with movement.    PERTINENT HISTORY: See MD notes.  Pt. Known well to PT clinic.   [place] Abbot [place], [place] - 09/30/2021 2:00 PM EDT Formatting of this note is different from the original. Images from the original note were not included. Chief Complaint Chief Complaint  Patient presents with  Left Knee - Pain   Reason for Visit The patient is a 76 year old female that presented for complaints of her left knee. For several weeks now she has been having some increased pain in the front surface of the knee and her quadriceps. She had been doing some  strengthening exercises with a leg press at the gym. She also walks on the treadmill on an incline. She has anterior knee pain on the left with no pain on the right. She has been taking Tylenol with no relief. She did try occasional naproxen. She has no popping or giving out and no locking in the knee. The patient is not a diabetic.   PAIN:  Are you having pain? Yes: NPRS scale: 0/10 Pain location: L knee Pain description: aching/ persistent Aggravating factors: walking/ movement Relieving factors: rest  PRECAUTIONS: None  WEIGHT BEARING RESTRICTIONS No  FALLS:  Has patient fallen in last 6 months? No  LIVING ENVIRONMENT: Lives with: lives with their spouse Lives in: House/apartment Stairs: Pt. Has pain with descending stairs.     OCCUPATION: retired  PLOF: Independent  PATIENT GOALS:  Decrease L knee pain/ return to gym   OBJECTIVE:   DIAGNOSTIC FINDINGS: Negative x-ray  PATIENT SURVEYS:  FOTO initial 59/ goal 18  COGNITION:  Overall cognitive status: Within functional limits for tasks assessed   SENSATION: WFL  EDEMA:  No swelling noted in L knee as compared to R.  Good joint line/ spacing  MUSCLE LENGTH: Hamstrings: Right WNL; Left WNL  POSTURE: Slight rounded shoulders and forward head  PALPATION: (+) L knee tenderness over infrapatellar tendon and posterior aspect of knee (?Bakers cyst).  LOWER EXTREMITY ROM:  B hip/knee AROM WNL.  L knee AROM in supine: 0-145 deg.    LOWER EXTREMITY MMT:  B LE muscle strength grossly 5/5 MMT except B hip flexion 4/5 MMT, B hip abduction 4+/5 MMT and L knee extension 4+/5 MMT (increase in pain).    LOWER EXTREMITY SPECIAL TESTS:  Knee special tests: Anterior drawer test: negative, Posterior drawer test: negative, McMurray's test: negative, and Patellafemoral apprehension test: positive    GAIT: Distance walked: in clinic Assistive device utilized: None Level of assistance: Complete Independence Comments:  normalized gait pattern.  Increase in L knee pain during eccentric quad control while descending stairs.      TODAY'S TREATMENT:  11/11/21  Subjective:  Pt. Reports 3-4/10 L knee pain and 2-3/10 R knee pain over past week.  Pt. Is concerned that R knee pain is worsening.  Pt. Continues with daily activity/ TM walking for 1 hour/day at a consistent cadence (no incline).  Pt. Continues to present with full B knee AROM/ good strength.  (+) tenderness over B distal quads/ medial aspects of knee.    No treatment today.      PATIENT EDUCATION:  Education details: SLR/ hip abduction/ quad sets Person educated: Patient and Spouse Education method: Explanation, Demonstration, and Handouts Education comprehension: verbalized understanding and returned demonstration   HOME EXERCISE PROGRAM: SLR/ hip abduction in sidelying/ tendon loading program.  Access Code: [email or web address] URL: [email or web address] Date: 10/29/2021 Prepared by: Dorcas Carrow  Exercises - Active Straight Leg Raise with Quad Set  - 1 x daily - 5 x weekly - 2 sets - 10 reps - Supine Bridge  - 1 x daily - 5 x weekly - 2 sets - 10 reps - Sidelying Hip Abduction  - 1 x daily - 5 x weekly - 2 sets - 10 reps - Wall Sit  - 1 x daily - 5 x weekly - 1 sets - 10 reps   ASSESSMENT:  CLINICAL IMPRESSION: Patient will contact PT after MD f/u.  Pt. Will f/u with Dr. Caryl Comes and benefit from referral to ortho MD.  Pt. Interested in discussing worsening symptoms and various tx. Options to decrease B knee pain.  Pt. Instructed to stay active and avoid pain provoking ex./ movement patterns.      OBJECTIVE IMPAIRMENTS decreased activity tolerance, decreased endurance, decreased mobility, difficulty walking, decreased ROM, impaired flexibility, and pain.   ACTIVITY LIMITATIONS lifting, bending, squatting, and locomotion level  PARTICIPATION LIMITATIONS: community activity and yard work  PERSONAL FACTORS Fitness and Past/current  experiences are also affecting patient's functional outcome.   REHAB POTENTIAL: Excellent  CLINICAL DECISION MAKING: Stable/uncomplicated  EVALUATION COMPLEXITY: Low   GOALS: Goals reviewed with patient? Yes  SHORT TERM GOALS: Target date: 10/30/21 Pt. Will be independent with HEP to increase B LE muscle strength/ return to gym based ex.  Baseline:  currently not participating with gym Goal status: Goal met   LONG TERM GOALS: Target date: 11/13/21  Pt. Will increase FOTO to 72 to improve pain-free mobility. Baseline: initial 59 Goal status: INITIAL  2.  Pt. Will increase B hip/ L quad strength 1/2 muscle grade to improve pain-free mobility/ return to gym.   Baseline:  See above Goal status: INITIAL  3.  Pt. Able to descend stairs with no c/o L knee pain to improve functional mobility.  Baseline: L knee pain during eccentric muscle control on L knee during R LE step downs.  Goal status: INITIAL    PLAN: PT FREQUENCY:  2x/week  PT DURATION: 4 weeks  PLANNED INTERVENTIONS: Therapeutic exercises, Therapeutic activity, Neuromuscular re-education, Balance training, Gait training, Patient/Family education, Self Care, Joint mobilization, Taping, Manual therapy, and Re-evaluation  PLAN FOR NEXT SESSION:  PT on hold at this time.  Pt. Will contact PT after MD f/u.    Pura Spice, PT, DPT # [phone] 11/11/2021, 12:26 PM

## 2021-11-13 ENCOUNTER — Encounter: Payer: Medicare Other | Admitting: Physical Therapy

## 2021-11-18 ENCOUNTER — Encounter: Payer: Medicare Other | Admitting: Physical Therapy

## 2021-11-20 ENCOUNTER — Encounter: Payer: Medicare Other | Admitting: Physical Therapy

## 2021-11-25 ENCOUNTER — Encounter: Payer: Medicare Other | Admitting: Physical Therapy

## 2021-11-27 ENCOUNTER — Encounter: Payer: Medicare Other | Admitting: Physical Therapy

## 2021-12-02 ENCOUNTER — Encounter: Payer: Medicare Other | Admitting: Physical Therapy

## 2021-12-04 ENCOUNTER — Encounter: Payer: Medicare Other | Admitting: Physical Therapy

## 2022-07-02 ENCOUNTER — Ambulatory Visit: Payer: Medicare Other | Admitting: Physical Therapy

## 2022-07-14 ENCOUNTER — Ambulatory Visit: Payer: Medicare Other | Admitting: Physical Therapy

## 2022-07-16 ENCOUNTER — Encounter: Payer: Medicare Other | Admitting: Physical Therapy

## 2022-07-21 ENCOUNTER — Encounter: Payer: Medicare Other | Admitting: Physical Therapy

## 2022-07-23 ENCOUNTER — Encounter: Payer: Medicare Other | Admitting: Physical Therapy

## 2022-07-28 ENCOUNTER — Encounter: Payer: Medicare Other | Admitting: Physical Therapy

## 2022-07-30 ENCOUNTER — Encounter: Payer: Medicare Other | Admitting: Physical Therapy

## 2022-08-04 ENCOUNTER — Encounter: Payer: Medicare Other | Admitting: Physical Therapy

## 2022-08-06 ENCOUNTER — Encounter: Payer: Medicare Other | Admitting: Physical Therapy

## 2022-12-11 ENCOUNTER — Ambulatory Visit: Payer: Medicare Other | Attending: Orthopedic Surgery | Admitting: Physical Therapy

## 2022-12-11 DIAGNOSIS — G8929 Other chronic pain: Secondary | ICD-10-CM | POA: Insufficient documentation

## 2022-12-11 DIAGNOSIS — M25512 Pain in left shoulder: Secondary | ICD-10-CM | POA: Diagnosis present

## 2022-12-11 DIAGNOSIS — M25561 Pain in right knee: Secondary | ICD-10-CM | POA: Diagnosis present

## 2022-12-11 DIAGNOSIS — M6281 Muscle weakness (generalized): Secondary | ICD-10-CM | POA: Diagnosis present

## 2022-12-11 DIAGNOSIS — M25562 Pain in left knee: Secondary | ICD-10-CM | POA: Diagnosis present

## 2022-12-18 ENCOUNTER — Ambulatory Visit: Payer: Medicare Other | Admitting: Physical Therapy

## 2022-12-18 DIAGNOSIS — M25561 Pain in right knee: Secondary | ICD-10-CM | POA: Diagnosis not present

## 2022-12-18 DIAGNOSIS — M6281 Muscle weakness (generalized): Secondary | ICD-10-CM

## 2022-12-18 DIAGNOSIS — G8929 Other chronic pain: Secondary | ICD-10-CM

## 2022-12-20 NOTE — Therapy (Signed)
OUTPATIENT PHYSICAL THERAPY LOWER EXTREMITY EVALUATION   Patient Name: Jillian [name] MRN: [id number] DOB:1945/09/23, 77 y.o., female Today's Date: 12/11/2022  END OF SESSION:  PT End of Session - 12/20/22 1544     Visit Number 1    Number of Visits 8    Date for PT Re-Evaluation 02/05/23    PT Start Time 1112    PT Stop Time 1202    PT Time Calculation (min) 50 min             No past medical history on file. 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 Reports chronic B knee pain around suprapatellar/infrapatellar tendon.  Pt. Had cortisone injections last week in B knees/ L shoulder.  Pt. Reports no improvement in knee pain since injections.  Pt. Enjoys walking 1 hour on TM at home and participating with ex. Program.  Pt. Reports 0/10 pain at rest and >5/10 at worst with movement.    PERTINENT HISTORY: Pt. Has been receiving PT for B knees at [facility] with no change in knee pain.  Pt. Well known to PT clinic.  PAIN:  Are you having pain? Yes: NPRS scale: 3/10 Pain location: B knee Pain description: aching/ persistent Aggravating factors: walking/ standing/ activity Relieving factors: rest  PRECAUTIONS: None  RED FLAGS: None   WEIGHT BEARING RESTRICTIONS: No  FALLS:  Has patient fallen in last 6 months? No  LIVING ENVIRONMENT: Lives with: lives with their spouse Lives in: House/apartment Stairs: knee pain ascending/ descending  stairs Has following equipment at home: None  OCCUPATION: Retired  PLOF: Independent  PATIENT GOALS: Decrease L shoulder/ B knee pain.    NEXT MD VISIT: 11/21 for shoulder/ 12/10 with Dr. Ernest Pine for knees.   OBJECTIVE:  Note: Objective measures were completed at Evaluation unless otherwise noted.  DIAGNOSTIC FINDINGS: see imaging  PATIENT SURVEYS:  FOTO initial 53/ goal 10  COGNITION: Overall cognitive status:  history of memory issues/ pts. Husband present during evaluation.        SENSATION: WFL  EDEMA:  No edema noted  MUSCLE LENGTH: Hamstrings: Right WNL; Left WNL Thomas test: NT  POSTURE: Slight rounded shoulders/ forward head.  Pt. Able to correct in sitting/ standing with cuing.    PALPATION: Tenderness reported over B suprapatellar/infrapatellar tendons.  Minimal discomfort over B knee joint line (medial>lateral).  No swelling or ecchymosis noted.   LOWER EXTREMITY ROM:  Active ROM Right eval Left eval  Hip flexion WNL WNL  Hip extension WNL WNL  Hip abduction WNL WNL  Hip adduction    Hip internal rotation WNL WNL  Hip external rotation WNL WNL  Knee flexion 145 deg. 145 deg.  Knee extension 0 deg.  0 deg.   Ankle dorsiflexion    Ankle plantarflexion    Ankle inversion    Ankle eversion     (Blank rows = not tested)  LOWER EXTREMITY MMT:  MMT Right eval Left eval  Hip flexion 4- 4-  Hip extension 4  4  Hip abduction 3+ 3+  Hip adduction    Hip internal rotation 4 4  Hip external rotation 4 4  Knee flexion 5 (pain) 5 (pain)  Knee extension 5 (pain) 5 (pain)  Ankle dorsiflexion    Ankle plantarflexion    Ankle inversion    Ankle eversion     (Blank rows = not tested)  LOWER EXTREMITY SPECIAL TESTS:  Knee special tests: Anterior drawer test: negative, Posterior drawer test: negative, McMurray's test: negative, and Patellafemoral apprehension test: negative  FUNCTIONAL TESTS:  5 times sit to stand: TBD  GAIT: Distance walked: in  clinic Assistive device utilized: None Level of assistance: Complete Independence Comments: normalized gait pattern.  Slight ER noted in B feet (L 15 deg./ R 10 deg.).  Consistent heel strike/ toe off.  Good cadence.    TODAY'S TREATMENT:                                                                                                                              DATE: 12/11/22  See evaluation/ HEP    PATIENT EDUCATION:  Education details: Discussed knee symptoms/ HEP Person educated: Patient and Spouse Education method: Explanation, Demonstration, and Handouts Education comprehension: verbalized understanding and returned demonstration  HOME EXERCISE PROGRAM: Access Code: [email or web address] URL: [email or web address] Date: 12/11/2022 Prepared by: Dorene Grebe  Exercises - Sit to Stand with Resistance Around Legs  - 1 x daily - 4 x weekly - 3 sets - 10 reps - Standing 3-Way Leg Reach with Resistance at Ankles and Unilateral Counter Support  - 1 x daily - 4 x weekly - 3 sets - 10 reps - Side Stepping with Resistance at Thighs and Counter Support  - 1 x daily - 4 x weekly - 3 sets - 10 reps - Supine Bridge with Mini Swiss Ball Between Knees  - 1 x daily - 4 x weekly - 3 sets - 10 reps  ASSESSMENT:  CLINICAL IMPRESSION: Patient is a pleasant 77 y.o. female who was seen today for physical therapy evaluation and treatment for B knee pain.  Pt. Reports persistent B knee pain with walking/ everyday tasks.  Pt. States she is constantly rubbing knees to manage symptoms.  Pt. Has excellent joint mobility/ LE flexibility but marked increase in knee pain with MMT.  Primary strength deficits in B hips (all planes).  Decrease c/o B knee pain with manual knee long axis distraction.  Good patellar tracking noted with knee flexion in open and closed chain during squats.  Pt. Will benefit from modification to gym based/HEP and skilled PT to improve functional mobility/ pain-free walking.     OBJECTIVE IMPAIRMENTS: Abnormal gait, decreased activity tolerance, decreased endurance, decreased mobility, difficulty walking, decreased strength, improper body mechanics, and pain.   ACTIVITY LIMITATIONS: squatting and locomotion level  PARTICIPATION LIMITATIONS: cleaning, community activity, and yard work  PERSONAL FACTORS: Past/current experiences are also affecting patient's functional outcome.   REHAB POTENTIAL: Good  CLINICAL DECISION MAKING: Evolving/moderate complexity  EVALUATION COMPLEXITY: Moderate   GOALS: Goals reviewed with patient? Yes  SHORT TERM GOALS: Target date: 01/08/23 Pt. Independent with HEP to increase B hip strength 1/2 muscle grade to improve walking/ mobility.   Baseline: see above Goal status: INITIAL   LONG TERM GOALS: Target date: 02/05/23  Pt. Will increase FOTO to 64 to improve pain-free functional mobility.   Baseline:  initial 53 Goal status: INITIAL  2.  Pt. Able to complete 5xSTS to <12 sec. With no knee pain to improve transfers/ mobility.   Baseline: TBD Goal status: INITIAL  3.  Pt. Able to return to daily TM walking with no c/o B knee pain to improve functional mobility.   Baseline: Persistent B knee pain with daily walking Goal status: INITIAL   PLAN:  PT FREQUENCY: 1-2x/week  PT DURATION: 8 weeks  PLANNED INTERVENTIONS: 97110-Therapeutic exercises, 97530- Therapeutic activity, [id number]- Neuromuscular re-education, 97535- Self Care, 62952- Manual therapy, [id number]- Gait training, [phone]- Aquatic Therapy, Stair training, Taping, Joint mobilization, and Cryotherapy  PLAN FOR NEXT SESSION: Manual tx. To B knees  Cammie Mcgee, PT, DPT # [phone] 12/20/2022, 3:46 PM

## 2022-12-20 NOTE — Therapy (Signed)
  OUTPATIENT PHYSICAL THERAPY LOWER EXTREMITY TREATMENT   Patient Name: Jillian [name] MRN: [id number] DOB:Feb 02, 1946, 77 y.o., female Today's Date: 12/18/2022  END OF SESSION:  PT End of Session - 12/20/22 1611     Visit Number 2    Number of Visits 8    Date for PT Re-Evaluation 02/05/23    PT Start Time 1106    PT Stop Time 1158    PT Time Calculation (min) 52 min             No past medical history on file. Past Surgical History:  Procedure Laterality Date   BREAST BIOPSY Right 1981   neg   BREAST EXCISIONAL BIOPSY       Cammie Mcgee, PT 12/20/2022, 4:12 PM

## 2022-12-21 NOTE — Addendum Note (Signed)
Addended by: Cammie Mcgee on: 12/21/2022 08:05 PM   Modules accepted: Orders

## 2022-12-25 ENCOUNTER — Ambulatory Visit: Payer: Medicare Other | Admitting: Physical Therapy

## 2023-01-08 ENCOUNTER — Encounter: Payer: Medicare Other | Admitting: Physical Therapy

## 2023-01-15 ENCOUNTER — Encounter: Payer: Medicare Other | Admitting: Physical Therapy

## 2023-01-22 ENCOUNTER — Encounter: Payer: Medicare Other | Admitting: Physical Therapy

## 2023-02-22 ENCOUNTER — Emergency Department
Admission: EM | Admit: 2023-02-22 | Discharge: 2023-02-22 | Disposition: A | Discharge status: Home / Self Care | Payer: Medicare Other | Attending: Emergency Medicine | Admitting: Emergency Medicine

## 2023-02-22 ENCOUNTER — Other Ambulatory Visit: Payer: Self-pay

## 2023-02-22 ENCOUNTER — Emergency Department: Payer: Medicare Other

## 2023-02-22 DIAGNOSIS — R4182 Altered mental status, unspecified: Secondary | ICD-10-CM | POA: Diagnosis not present

## 2023-02-22 DIAGNOSIS — I6782 Cerebral ischemia: Secondary | ICD-10-CM | POA: Diagnosis not present

## 2023-02-22 DIAGNOSIS — E785 Hyperlipidemia, unspecified: Secondary | ICD-10-CM | POA: Insufficient documentation

## 2023-02-22 DIAGNOSIS — R0602 Shortness of breath: Secondary | ICD-10-CM | POA: Insufficient documentation

## 2023-02-22 DIAGNOSIS — R251 Tremor, unspecified: Secondary | ICD-10-CM | POA: Diagnosis not present

## 2023-02-22 DIAGNOSIS — I129 Hypertensive chronic kidney disease with stage 1 through stage 4 chronic kidney disease, or unspecified chronic kidney disease: Secondary | ICD-10-CM | POA: Insufficient documentation

## 2023-02-22 DIAGNOSIS — Z20822 Contact with and (suspected) exposure to covid-19: Secondary | ICD-10-CM | POA: Insufficient documentation

## 2023-02-22 DIAGNOSIS — N189 Chronic kidney disease, unspecified: Secondary | ICD-10-CM | POA: Diagnosis not present

## 2023-02-22 DIAGNOSIS — M810 Age-related osteoporosis without current pathological fracture: Secondary | ICD-10-CM | POA: Diagnosis not present

## 2023-02-22 DIAGNOSIS — I10 Essential (primary) hypertension: Secondary | ICD-10-CM

## 2023-02-22 DIAGNOSIS — R519 Headache, unspecified: Secondary | ICD-10-CM | POA: Insufficient documentation

## 2023-02-22 LAB — CBC
HCT: 44.3 % (ref 36.0–46.0)
Hemoglobin: 15.4 g/dL — ABNORMAL HIGH (ref 12.0–15.0)
MCH: 29.6 pg (ref 26.0–34.0)
MCHC: 34.8 g/dL (ref 30.0–36.0)
MCV: 85.2 fL (ref 80.0–100.0)
Platelets: 157 10*3/uL (ref 150–400)
RBC: 5.2 MIL/uL — ABNORMAL HIGH (ref 3.87–5.11)
RDW: 13.2 % (ref 11.5–15.5)
WBC: 6.8 10*3/uL (ref 4.0–10.5)
nRBC: 0 % (ref 0.0–0.2)

## 2023-02-22 LAB — RESP PANEL BY RT-PCR (RSV, FLU A&B, COVID)  RVPGX2
Influenza A by PCR: NEGATIVE
Influenza B by PCR: NEGATIVE
Resp Syncytial Virus by PCR: NEGATIVE
SARS Coronavirus 2 by RT PCR: NEGATIVE

## 2023-02-22 LAB — BASIC METABOLIC PANEL
Anion gap: 15 (ref 5–15)
BUN: 21 mg/dL (ref 8–23)
CO2: 20 mmol/L — ABNORMAL LOW (ref 22–32)
Calcium: 9.7 mg/dL (ref 8.9–10.3)
Chloride: 97 mmol/L — ABNORMAL LOW (ref 98–111)
Creatinine, Ser: 1.03 mg/dL — ABNORMAL HIGH (ref 0.44–1.00)
GFR, Estimated: 56 mL/min — ABNORMAL LOW (ref >60)
Glucose, Bld: 125 mg/dL — ABNORMAL HIGH (ref 70–99)
Potassium: 3.3 mmol/L — ABNORMAL LOW (ref 3.5–5.1)
Sodium: 132 mmol/L — ABNORMAL LOW (ref 135–145)

## 2023-02-22 LAB — TROPONIN I (HIGH SENSITIVITY): Troponin I (High Sensitivity): 4 ng/L (ref <18)

## 2023-02-22 LAB — URINALYSIS, ROUTINE W REFLEX MICROSCOPIC
Bacteria, UA: NONE SEEN
Bilirubin Urine: NEGATIVE
Glucose, UA: NEGATIVE mg/dL
Ketones, ur: NEGATIVE mg/dL
Leukocytes,Ua: NEGATIVE
Nitrite: NEGATIVE
Protein, ur: NEGATIVE mg/dL
Specific Gravity, Urine: 1.005 (ref 1.005–1.030)
pH: 7 (ref 5.0–8.0)

## 2023-02-22 NOTE — Discharge Instructions (Signed)
Keep an eye on your blood pressure over the next several days.  Take the amlodipine as prescribed.  Follow-up with your primary care provider.  Return to the ER for new, worsening, or persistent severe elevated blood pressures, headache, change in mental status, confusion, difficulty breathing, feel like you are going to pass out, or any other new or worsening symptoms that concern you.

## 2023-02-22 NOTE — ED Provider Notes (Signed)
[facility] Provider Note    Event Date/Time   First MD Initiated Contact with Patient 02/22/23 1705     (approximate)   History   Shaking and Generalized Body Aches   HPI  Jillian [name] is a 78 y.o. female with a history of CKD, hypertension, hyperlipidemia and osteoporosis who presents with an episode of shakiness and possible change in her mental status.  The patient and her husband state that she woke up with a diffuse headache this morning which got worse over a few hours.  She then checked her blood pressure several times and it was as high as 180 systolic.  She took an amlodipine, but after approximately hour the blood pressure was not improved.  Subsequently, the patient started to have diffuse shaking and tremors and started to say that she felt very unwell.  This lasted for some time and they brought her to the hospital.  While in the waiting room for approximately 2 hours, the patient continued to be very shaky, started to have shortness of breath, and felt like she was about to pass out.  However, the symptoms have now completely resolved.  She is now back to her baseline.  The husband states that the patient has become somewhat more confused gradually over the last year with no acute change today.  The patient currently denies any fever, chest pain, difficulty breathing, vomiting or diarrhea, or other acute symptoms.  I reviewed the past medical records.  The patient was most recently seen by Dr. Graciela Husbands from internal medicine on 1/14 for follow-up of her chronic conditions.  She at that time reported taking amlodipine as needed when her blood pressure was elevated.   Physical Exam   Triage Vital Signs: ED Triage Vitals  Encounter Vitals Group     BP 02/22/23 1459 (!) 169/98     Systolic BP Percentile --      Diastolic BP Percentile --      Pulse Rate 02/22/23 1459 71     Resp 02/22/23 1459 19     Temp 02/22/23 1459 (!) 97.5 F (36.4  C)     Temp Source 02/22/23 1459 Oral     SpO2 02/22/23 1459 100 %     Weight 02/22/23 1500 126 lb (57.2 kg)     Height 02/22/23 1500 5\' 8"  (1.727 m)     Head Circumference --      Peak Flow --      Pain Score 02/22/23 1459 0     Pain Loc --      Pain Education --      Exclude from Growth Chart --     Most recent vital signs: Vitals:   02/22/23 1459 02/22/23 1749  BP: (!) 169/98 (!) 155/74  Pulse: 71 81  Resp: 19 18  Temp: (!) 97.5 F (36.4 C) 97.8 F (36.6 C)  SpO2: 100% 96%     General: Alert, oriented x 2 (knows the month and year but not that she is in a hospital), no distress.  CV:  Good peripheral perfusion.  Normal heart sounds. Resp:  Normal effort.  Lungs CTAB. Abd:  No distention.  Other:  EOMI.  PERRLA.  No facial droop.  Normal speech.  Motor intact in all extremities.  No ataxia on finger-to-nose.  No pronator drift.   ED Results / Procedures / Treatments   Labs (all labs ordered are listed, but only abnormal results are displayed) Labs Reviewed  BASIC METABOLIC  PANEL - Abnormal; Notable for the following components:      Result Value   Sodium 132 (*)    Potassium 3.3 (*)    Chloride 97 (*)    CO2 20 (*)    Glucose, Bld 125 (*)    Creatinine, Ser 1.03 (*)    GFR, Estimated 56 (*)    All other components within normal limits  CBC - Abnormal; Notable for the following components:   RBC 5.20 (*)    Hemoglobin 15.4 (*)    All other components within normal limits  URINALYSIS, ROUTINE W REFLEX MICROSCOPIC - Abnormal; Notable for the following components:   Color, Urine STRAW (*)    APPearance CLEAR (*)    Hgb urine dipstick SMALL (*)    All other components within normal limits  RESP PANEL BY RT-PCR (RSV, FLU A&B, COVID)  [id number]  TROPONIN I (HIGH SENSITIVITY)     EKG  ED ECG REPORT I, Dionne Bucy, the attending physician, personally viewed and interpreted this ECG.  Date: 02/22/2023 EKG Time: 1506 Rate: 75 Rhythm: normal sinus  rhythm QRS Axis: normal Intervals: normal ST/T Wave abnormalities: Nonspecific ST abnormalities Narrative Interpretation: no evidence of acute ischemia; no recent prior EKG available for comparison    RADIOLOGY  Chest x-ray: I independently viewed and interpreted the images; there is no focal consolidation or edema  PROCEDURES:  Critical Care performed: No  Procedures   MEDICATIONS ORDERED IN ED: Medications - No data to display   IMPRESSION / MDM / ASSESSMENT AND PLAN / ED COURSE  I reviewed the triage vital signs and the nursing notes.  78 year old female with PMH as noted above presents with initially headache, elevated blood pressure, and then a prolonged episode in which she was awake but extremely shaky, short of breath, and near syncopal.  This lasted for several hours before resolving while she was in the waiting room.  Currently on exam the patient is somewhat hypertensive.  Other vital signs are normal.  She is alert although only oriented x 2, at baseline per her husband.  Thorough neurologic exam is normal.  Differential diagnosis includes, but is not limited to, dehydration, electrolyte abnormality, other metabolic disturbance, UTI, viral syndrome, other acute infection, acute anxiety/panic, vasovagal near syncope.  Given that the patient had no focal neurologic symptoms and primarily generalized shakiness and near syncope, this is not consistent with a TIA or CVA.  There is no evidence that she had a seizure.  There also is no evidence of hypertensive emergency.  Patient's presentation is most consistent with acute presentation with potential threat to life or bodily function.  Initial workup is reassuring.  BMP shows slightly low bicarb but is otherwise unremarkable.  Troponin is negative.  CBC shows no acute findings.  Respiratory panel is negative.  I have added on a CT head, urine, and we will reassess.  ----------------------------------------- 7:30 PM on  02/22/2023 -----------------------------------------  Urinalysis shows no acute findings.  CT head is also negative.  The patient remains asymptomatic.  I considered whether she may benefit from inpatient admission for further monitoring given the near syncope.  I did offer this to the patient and her husband.  However, they advised that given the resolution of the symptoms and reassuring workup, they would strongly prefer to go home, which I feel is reasonable.  I counseled him on the results of the workup.  I recommended that she keep a close eye on her blood pressure over the next week  and start taking the amlodipine daily if necessary.  I also recommend that they follow-up with the primary care provider.  I gave strict return precautions and the patient and husband expressed understanding.   FINAL CLINICAL IMPRESSION(S) / ED DIAGNOSES   Final diagnoses:  Hypertension, unspecified type  Shakiness     Rx / DC Orders   ED Discharge Orders     None        Note:  This document was prepared using Dragon voice recognition software and may include unintentional dictation errors.    Dionne Bucy, MD 02/22/23 2230

## 2023-02-22 NOTE — ED Triage Notes (Signed)
Pt husband sts that pt took a BP pill this AM due to her BP being elevated. Pt does not take any BP regularly. Pt sts that he is having the shakes and is having trouble breathing. Pt is cool to the touch.

## 2024-01-20 ENCOUNTER — Telehealth: Payer: Self-pay | Admitting: Orthopedic Surgery

## 2024-01-20 NOTE — Telephone Encounter (Signed)
 Patient's spouse called to request an appointment for his wife for headaches that stem from the back of her head, up her head, and across her forehead. I spoke with Kendelyn and she discussed the information with Glade Boys. Per Glade, she is happy to see the patient, but wanted to be sure the patient was aware that she may refer her back to Neurology. Patient's spouse notified and expressed understanding. He was going to talk this over with his wife and call our office back if he wished to proceed with scheduling an appointment.
